# Patient Record
Sex: Female | Born: 1960 | Race: White | Hispanic: No | Smoking: Former smoker
Health system: Southern US, Community
[De-identification: ages and names within clinical notes are randomized; demographics above are authoritative.]

## PROBLEM LIST (undated history)

## (undated) DIAGNOSIS — K220 Achalasia of cardia: Secondary | ICD-10-CM

## (undated) DIAGNOSIS — I10 Essential (primary) hypertension: Secondary | ICD-10-CM

## (undated) DIAGNOSIS — K635 Polyp of colon: Secondary | ICD-10-CM

## (undated) DIAGNOSIS — B353 Tinea pedis: Secondary | ICD-10-CM

## (undated) DIAGNOSIS — Z79899 Other long term (current) drug therapy: Secondary | ICD-10-CM

## (undated) DIAGNOSIS — E785 Hyperlipidemia, unspecified: Secondary | ICD-10-CM

## (undated) DIAGNOSIS — D649 Anemia, unspecified: Secondary | ICD-10-CM

## (undated) DIAGNOSIS — C50919 Malignant neoplasm of unspecified site of unspecified female breast: Secondary | ICD-10-CM

## (undated) DIAGNOSIS — K219 Gastro-esophageal reflux disease without esophagitis: Secondary | ICD-10-CM

## (undated) DIAGNOSIS — I341 Nonrheumatic mitral (valve) prolapse: Secondary | ICD-10-CM

## (undated) HISTORY — DX: Tinea pedis: B35.3

## (undated) HISTORY — PX: OTHER SURGICAL HISTORY: SHX169

## (undated) HISTORY — DX: Other long term (current) drug therapy: Z79.899

## (undated) HISTORY — DX: Hyperlipidemia, unspecified: E78.5

## (undated) HISTORY — PX: APPENDECTOMY: SHX54

## (undated) HISTORY — DX: Achalasia of cardia: K22.0

## (undated) HISTORY — DX: Malignant neoplasm of unspecified site of unspecified female breast: C50.919

## (undated) HISTORY — DX: Essential (primary) hypertension: I10

## (undated) HISTORY — PX: BREAST SURGERY: SHX581

## (undated) HISTORY — DX: Polyp of colon: K63.5

---

## 2000-08-14 ENCOUNTER — Inpatient Hospital Stay (HOSPITAL_COMMUNITY): Admission: AD | Admit: 2000-08-14 | Discharge: 2000-08-14 | Payer: Self-pay | Admitting: Obstetrics and Gynecology

## 2000-08-15 ENCOUNTER — Encounter: Payer: Self-pay | Admitting: Obstetrics and Gynecology

## 2000-09-05 ENCOUNTER — Ambulatory Visit (HOSPITAL_COMMUNITY): Admission: RE | Admit: 2000-09-05 | Discharge: 2000-09-05 | Payer: Self-pay | Admitting: Obstetrics and Gynecology

## 2000-09-05 ENCOUNTER — Encounter: Payer: Self-pay | Admitting: Obstetrics and Gynecology

## 2001-02-06 ENCOUNTER — Inpatient Hospital Stay (HOSPITAL_COMMUNITY): Admission: AD | Admit: 2001-02-06 | Discharge: 2001-02-08 | Payer: Self-pay | Admitting: Obstetrics and Gynecology

## 2001-02-26 ENCOUNTER — Encounter: Admission: RE | Admit: 2001-02-26 | Discharge: 2001-03-28 | Payer: Self-pay | Admitting: Obstetrics and Gynecology

## 2001-04-28 ENCOUNTER — Encounter: Admission: RE | Admit: 2001-04-28 | Discharge: 2001-05-28 | Payer: Self-pay | Admitting: Obstetrics and Gynecology

## 2001-06-28 ENCOUNTER — Encounter: Admission: RE | Admit: 2001-06-28 | Discharge: 2001-07-28 | Payer: Self-pay | Admitting: Obstetrics and Gynecology

## 2001-07-29 ENCOUNTER — Encounter: Admission: RE | Admit: 2001-07-29 | Discharge: 2001-08-28 | Payer: Self-pay | Admitting: Obstetrics and Gynecology

## 2001-08-12 ENCOUNTER — Encounter: Admission: RE | Admit: 2001-08-12 | Discharge: 2001-08-12 | Payer: Self-pay | Admitting: Obstetrics and Gynecology

## 2001-08-12 ENCOUNTER — Encounter: Payer: Self-pay | Admitting: Obstetrics and Gynecology

## 2001-09-18 ENCOUNTER — Encounter (INDEPENDENT_AMBULATORY_CARE_PROVIDER_SITE_OTHER): Payer: Self-pay | Admitting: *Deleted

## 2001-09-18 ENCOUNTER — Ambulatory Visit (HOSPITAL_BASED_OUTPATIENT_CLINIC_OR_DEPARTMENT_OTHER): Admission: RE | Admit: 2001-09-18 | Discharge: 2001-09-18 | Payer: Self-pay | Admitting: *Deleted

## 2001-09-26 ENCOUNTER — Encounter: Admission: RE | Admit: 2001-09-26 | Discharge: 2001-10-26 | Payer: Self-pay | Admitting: Obstetrics and Gynecology

## 2002-02-18 ENCOUNTER — Encounter: Payer: Self-pay | Admitting: Obstetrics and Gynecology

## 2002-02-18 ENCOUNTER — Encounter: Admission: RE | Admit: 2002-02-18 | Discharge: 2002-02-18 | Payer: Self-pay | Admitting: Obstetrics and Gynecology

## 2002-09-20 ENCOUNTER — Other Ambulatory Visit: Admission: RE | Admit: 2002-09-20 | Discharge: 2002-09-20 | Payer: Self-pay | Admitting: Obstetrics and Gynecology

## 2003-04-06 ENCOUNTER — Encounter: Admission: RE | Admit: 2003-04-06 | Discharge: 2003-04-06 | Payer: Self-pay | Admitting: Obstetrics and Gynecology

## 2003-04-06 ENCOUNTER — Encounter: Payer: Self-pay | Admitting: Obstetrics and Gynecology

## 2003-04-08 ENCOUNTER — Encounter: Payer: Self-pay | Admitting: Obstetrics and Gynecology

## 2003-04-08 ENCOUNTER — Encounter: Admission: RE | Admit: 2003-04-08 | Discharge: 2003-04-08 | Payer: Self-pay | Admitting: Obstetrics and Gynecology

## 2003-04-08 ENCOUNTER — Encounter (INDEPENDENT_AMBULATORY_CARE_PROVIDER_SITE_OTHER): Payer: Self-pay | Admitting: Specialist

## 2003-11-29 ENCOUNTER — Other Ambulatory Visit: Admission: RE | Admit: 2003-11-29 | Discharge: 2003-11-29 | Payer: Self-pay | Admitting: Obstetrics and Gynecology

## 2004-04-25 ENCOUNTER — Encounter: Admission: RE | Admit: 2004-04-25 | Discharge: 2004-04-25 | Payer: Self-pay | Admitting: Obstetrics and Gynecology

## 2004-05-01 ENCOUNTER — Encounter: Admission: RE | Admit: 2004-05-01 | Discharge: 2004-05-01 | Payer: Self-pay | Admitting: Obstetrics and Gynecology

## 2004-12-28 ENCOUNTER — Other Ambulatory Visit: Admission: RE | Admit: 2004-12-28 | Discharge: 2004-12-28 | Payer: Self-pay | Admitting: Obstetrics and Gynecology

## 2005-05-24 ENCOUNTER — Encounter: Admission: RE | Admit: 2005-05-24 | Discharge: 2005-05-24 | Payer: Self-pay | Admitting: Obstetrics and Gynecology

## 2006-08-23 DIAGNOSIS — C50919 Malignant neoplasm of unspecified site of unspecified female breast: Secondary | ICD-10-CM

## 2006-08-23 HISTORY — DX: Malignant neoplasm of unspecified site of unspecified female breast: C50.919

## 2006-08-26 ENCOUNTER — Encounter: Admission: RE | Admit: 2006-08-26 | Discharge: 2006-08-26 | Payer: Self-pay | Admitting: *Deleted

## 2006-09-02 ENCOUNTER — Encounter (INDEPENDENT_AMBULATORY_CARE_PROVIDER_SITE_OTHER): Payer: Self-pay | Admitting: Specialist

## 2006-09-02 ENCOUNTER — Encounter (INDEPENDENT_AMBULATORY_CARE_PROVIDER_SITE_OTHER): Payer: Self-pay | Admitting: Radiology

## 2006-09-02 ENCOUNTER — Encounter: Admission: RE | Admit: 2006-09-02 | Discharge: 2006-09-02 | Payer: Self-pay | Admitting: *Deleted

## 2006-09-10 ENCOUNTER — Encounter: Admission: RE | Admit: 2006-09-10 | Discharge: 2006-09-10 | Payer: Self-pay | Admitting: Obstetrics and Gynecology

## 2006-09-16 ENCOUNTER — Ambulatory Visit: Payer: Self-pay | Admitting: Oncology

## 2006-11-12 ENCOUNTER — Inpatient Hospital Stay (HOSPITAL_COMMUNITY): Admission: RE | Admit: 2006-11-12 | Discharge: 2006-11-16 | Payer: Self-pay | Admitting: *Deleted

## 2006-11-12 ENCOUNTER — Encounter (INDEPENDENT_AMBULATORY_CARE_PROVIDER_SITE_OTHER): Payer: Self-pay | Admitting: *Deleted

## 2006-11-12 HISTORY — PX: MASTECTOMY: SHX3

## 2006-11-18 ENCOUNTER — Ambulatory Visit: Payer: Self-pay | Admitting: Oncology

## 2006-11-26 LAB — CBC WITH DIFFERENTIAL/PLATELET
Basophils Absolute: 0 10*3/uL (ref 0.0–0.1)
Eosinophils Absolute: 0.2 10*3/uL (ref 0.0–0.5)
HGB: 10.6 g/dL — ABNORMAL LOW (ref 11.6–15.9)
LYMPH%: 15.4 % (ref 14.0–48.0)
MCV: 89.3 fL (ref 81.0–101.0)
MONO#: 0.6 10*3/uL (ref 0.1–0.9)
MONO%: 5.2 % (ref 0.0–13.0)
NEUT#: 8.3 10*3/uL — ABNORMAL HIGH (ref 1.5–6.5)
Platelets: 519 10*3/uL — ABNORMAL HIGH (ref 145–400)
RBC: 3.42 10*6/uL — ABNORMAL LOW (ref 3.70–5.32)
RDW: 12.7 % (ref 11.3–14.5)
WBC: 10.8 10*3/uL — ABNORMAL HIGH (ref 3.9–10.0)

## 2006-11-29 LAB — COMPREHENSIVE METABOLIC PANEL
Alkaline Phosphatase: 58 U/L (ref 39–117)
CO2: 24 mEq/L (ref 19–32)
Creatinine, Ser: 0.64 mg/dL (ref 0.40–1.20)
Glucose, Bld: 107 mg/dL — ABNORMAL HIGH (ref 70–99)
Sodium: 138 mEq/L (ref 135–145)
Total Bilirubin: 0.5 mg/dL (ref 0.3–1.2)
Total Protein: 6.5 g/dL (ref 6.0–8.3)

## 2006-11-29 LAB — CANCER ANTIGEN 27.29: CA 27.29: 7 U/mL (ref 0–39)

## 2006-11-29 LAB — VITAMIN D PNL(25-HYDRXY+1,25-DIHY)-BLD
Vit D, 1,25-Dihydroxy: 16 pg/mL (ref 6–62)
Vit D, 25-Hydroxy: 29 ng/mL (ref 20–57)

## 2006-11-29 LAB — LACTATE DEHYDROGENASE: LDH: 149 U/L (ref 94–250)

## 2006-12-11 ENCOUNTER — Ambulatory Visit: Payer: Self-pay | Admitting: Psychiatry

## 2006-12-16 ENCOUNTER — Ambulatory Visit: Payer: Self-pay | Admitting: Psychiatry

## 2007-02-17 ENCOUNTER — Ambulatory Visit: Payer: Self-pay | Admitting: Oncology

## 2007-04-15 ENCOUNTER — Ambulatory Visit: Payer: Self-pay | Admitting: Oncology

## 2007-07-09 ENCOUNTER — Encounter: Admission: RE | Admit: 2007-07-09 | Discharge: 2007-07-09 | Payer: Self-pay | Admitting: General Surgery

## 2007-08-28 ENCOUNTER — Ambulatory Visit: Payer: Self-pay | Admitting: Oncology

## 2007-09-01 LAB — COMPREHENSIVE METABOLIC PANEL
AST: 17 U/L (ref 0–37)
Alkaline Phosphatase: 42 U/L (ref 39–117)
BUN: 11 mg/dL (ref 6–23)
Creatinine, Ser: 0.73 mg/dL (ref 0.40–1.20)
Glucose, Bld: 87 mg/dL (ref 70–99)
Total Bilirubin: 0.5 mg/dL (ref 0.3–1.2)

## 2007-09-01 LAB — CBC WITH DIFFERENTIAL/PLATELET
Basophils Absolute: 0 10*3/uL (ref 0.0–0.1)
EOS%: 1.3 % (ref 0.0–7.0)
Eosinophils Absolute: 0.1 10*3/uL (ref 0.0–0.5)
HCT: 36.6 % (ref 34.8–46.6)
HGB: 12.8 g/dL (ref 11.6–15.9)
LYMPH%: 25.7 % (ref 14.0–48.0)
MCH: 31.4 pg (ref 26.0–34.0)
MCV: 89.9 fL (ref 81.0–101.0)
MONO%: 5.4 % (ref 0.0–13.0)
NEUT#: 5.7 10*3/uL (ref 1.5–6.5)
NEUT%: 67.2 % (ref 39.6–76.8)
Platelets: 273 10*3/uL (ref 145–400)
RDW: 12.9 % (ref 11.3–14.5)

## 2007-09-15 ENCOUNTER — Encounter: Admission: RE | Admit: 2007-09-15 | Discharge: 2007-09-15 | Payer: Self-pay | Admitting: Oncology

## 2007-09-29 ENCOUNTER — Encounter: Admission: RE | Admit: 2007-09-29 | Discharge: 2007-09-29 | Payer: Self-pay | Admitting: Oncology

## 2007-11-03 ENCOUNTER — Encounter: Admission: RE | Admit: 2007-11-03 | Discharge: 2007-11-03 | Payer: Self-pay | Admitting: Oncology

## 2008-02-26 ENCOUNTER — Ambulatory Visit: Payer: Self-pay | Admitting: Oncology

## 2008-03-03 LAB — COMPREHENSIVE METABOLIC PANEL
AST: 13 U/L (ref 0–37)
Alkaline Phosphatase: 43 U/L (ref 39–117)
BUN: 15 mg/dL (ref 6–23)
Glucose, Bld: 114 mg/dL — ABNORMAL HIGH (ref 70–99)
Potassium: 4.8 mEq/L (ref 3.5–5.3)
Total Bilirubin: 0.4 mg/dL (ref 0.3–1.2)

## 2008-03-03 LAB — CBC WITH DIFFERENTIAL/PLATELET
Basophils Absolute: 0 10*3/uL (ref 0.0–0.1)
EOS%: 2.9 % (ref 0.0–7.0)
Eosinophils Absolute: 0.2 10*3/uL (ref 0.0–0.5)
HGB: 13.1 g/dL (ref 11.6–15.9)
LYMPH%: 24.6 % (ref 14.0–48.0)
MCH: 32 pg (ref 26.0–34.0)
MCV: 92 fL (ref 81.0–101.0)
MONO%: 6 % (ref 0.0–13.0)
NEUT#: 4.3 10*3/uL (ref 1.5–6.5)
Platelets: 260 10*3/uL (ref 145–400)
RBC: 4.07 10*6/uL (ref 3.70–5.32)
RDW: 13 % (ref 11.3–14.5)

## 2008-08-29 ENCOUNTER — Ambulatory Visit: Payer: Self-pay | Admitting: Oncology

## 2008-08-31 LAB — CBC WITH DIFFERENTIAL/PLATELET
BASO%: 0.2 % (ref 0.0–2.0)
EOS%: 2.1 % (ref 0.0–7.0)
HGB: 12.9 g/dL (ref 11.6–15.9)
MCH: 31.4 pg (ref 25.1–34.0)
MCHC: 34.6 g/dL (ref 31.5–36.0)
MCV: 90.9 fL (ref 79.5–101.0)
MONO%: 4.2 % (ref 0.0–14.0)
RBC: 4.09 10*6/uL (ref 3.70–5.45)
RDW: 13 % (ref 11.2–14.5)
lymph#: 1.9 10*3/uL (ref 0.9–3.3)

## 2008-08-31 LAB — COMPREHENSIVE METABOLIC PANEL
ALT: 11 U/L (ref 0–35)
AST: 14 U/L (ref 0–37)
Albumin: 4.1 g/dL (ref 3.5–5.2)
Alkaline Phosphatase: 43 U/L (ref 39–117)
Calcium: 8.9 mg/dL (ref 8.4–10.5)
Chloride: 105 mEq/L (ref 96–112)
Potassium: 4.1 mEq/L (ref 3.5–5.3)

## 2008-09-19 ENCOUNTER — Ambulatory Visit: Payer: Self-pay | Admitting: Diagnostic Radiology

## 2008-09-19 ENCOUNTER — Ambulatory Visit (HOSPITAL_BASED_OUTPATIENT_CLINIC_OR_DEPARTMENT_OTHER): Admission: RE | Admit: 2008-09-19 | Discharge: 2008-09-19 | Payer: Self-pay | Admitting: Oncology

## 2009-01-12 ENCOUNTER — Ambulatory Visit: Payer: Self-pay | Admitting: Oncology

## 2009-01-16 LAB — COMPREHENSIVE METABOLIC PANEL
ALT: 14 U/L (ref 0–35)
AST: 21 U/L (ref 0–37)
Albumin: 3.6 g/dL (ref 3.5–5.2)
BUN: 12 mg/dL (ref 6–23)
Calcium: 9.3 mg/dL (ref 8.4–10.5)
Chloride: 106 mEq/L (ref 96–112)
Potassium: 4 mEq/L (ref 3.5–5.3)
Sodium: 137 mEq/L (ref 135–145)
Total Protein: 7 g/dL (ref 6.0–8.3)

## 2009-01-16 LAB — CBC WITH DIFFERENTIAL/PLATELET
BASO%: 0.4 % (ref 0.0–2.0)
Eosinophils Absolute: 0.4 10*3/uL (ref 0.0–0.5)
MCV: 88 fL (ref 79.5–101.0)
MONO%: 6.1 % (ref 0.0–14.0)
NEUT#: 5.3 10*3/uL (ref 1.5–6.5)
RBC: 3.91 10*6/uL (ref 3.70–5.45)
RDW: 14.8 % — ABNORMAL HIGH (ref 11.2–14.5)
WBC: 8.4 10*3/uL (ref 3.9–10.3)
nRBC: 0 % (ref 0–0)

## 2009-01-16 LAB — RESEARCH LABS

## 2009-05-30 ENCOUNTER — Ambulatory Visit: Payer: Self-pay | Admitting: Oncology

## 2009-06-06 ENCOUNTER — Encounter: Admission: RE | Admit: 2009-06-06 | Discharge: 2009-06-06 | Payer: Self-pay | Admitting: Oncology

## 2009-09-26 ENCOUNTER — Ambulatory Visit: Payer: Self-pay | Admitting: Oncology

## 2009-09-28 ENCOUNTER — Encounter: Admission: RE | Admit: 2009-09-28 | Discharge: 2009-09-28 | Payer: Self-pay | Admitting: Oncology

## 2009-09-28 LAB — CBC WITH DIFFERENTIAL/PLATELET
BASO%: 0.5 % (ref 0.0–2.0)
Basophils Absolute: 0 10*3/uL (ref 0.0–0.1)
EOS%: 1 % (ref 0.0–7.0)
Eosinophils Absolute: 0.1 10*3/uL (ref 0.0–0.5)
HCT: 37.5 % (ref 34.8–46.6)
HGB: 12.9 g/dL (ref 11.6–15.9)
LYMPH%: 22.7 % (ref 14.0–49.7)
MCH: 31.8 pg (ref 25.1–34.0)
MCHC: 34.4 g/dL (ref 31.5–36.0)
MCV: 92.2 fL (ref 79.5–101.0)
MONO#: 0.4 10*3/uL (ref 0.1–0.9)
MONO%: 4.5 % (ref 0.0–14.0)
NEUT#: 5.9 10*3/uL (ref 1.5–6.5)
NEUT%: 71.3 % (ref 38.4–76.8)
Platelets: 263 10*3/uL (ref 145–400)
RBC: 4.06 10*6/uL (ref 3.70–5.45)
RDW: 13.7 % (ref 11.2–14.5)
WBC: 8.3 10*3/uL (ref 3.9–10.3)
lymph#: 1.9 10*3/uL (ref 0.9–3.3)

## 2009-09-28 LAB — CANCER ANTIGEN 27.29: CA 27.29: 24 U/mL (ref 0–39)

## 2009-09-28 LAB — COMPREHENSIVE METABOLIC PANEL
ALT: 17 U/L (ref 0–35)
AST: 22 U/L (ref 0–37)
Albumin: 3.9 g/dL (ref 3.5–5.2)
Alkaline Phosphatase: 36 U/L — ABNORMAL LOW (ref 39–117)
BUN: 13 mg/dL (ref 6–23)
CO2: 26 mEq/L (ref 19–32)
Calcium: 9.1 mg/dL (ref 8.4–10.5)
Chloride: 106 mEq/L (ref 96–112)
Creatinine, Ser: 0.84 mg/dL (ref 0.40–1.20)
Glucose, Bld: 128 mg/dL — ABNORMAL HIGH (ref 70–99)
Potassium: 3.8 mEq/L (ref 3.5–5.3)
Sodium: 139 mEq/L (ref 135–145)
Total Bilirubin: 0.6 mg/dL (ref 0.3–1.2)
Total Protein: 7.1 g/dL (ref 6.0–8.3)

## 2010-04-04 ENCOUNTER — Ambulatory Visit: Payer: Self-pay | Admitting: Oncology

## 2010-04-06 LAB — CBC WITH DIFFERENTIAL/PLATELET
BASO%: 0.5 % (ref 0.0–2.0)
Basophils Absolute: 0 10*3/uL (ref 0.0–0.1)
EOS%: 2.9 % (ref 0.0–7.0)
Eosinophils Absolute: 0.2 10*3/uL (ref 0.0–0.5)
HCT: 37.2 % (ref 34.8–46.6)
HGB: 12.9 g/dL (ref 11.6–15.9)
LYMPH%: 23.5 % (ref 14.0–49.7)
MCH: 31.6 pg (ref 25.1–34.0)
MCHC: 34.6 g/dL (ref 31.5–36.0)
MCV: 91.4 fL (ref 79.5–101.0)
MONO#: 0.4 10*3/uL (ref 0.1–0.9)
MONO%: 5.8 % (ref 0.0–14.0)
NEUT#: 4.9 10*3/uL (ref 1.5–6.5)
NEUT%: 67.3 % (ref 38.4–76.8)
Platelets: 249 10*3/uL (ref 145–400)
RBC: 4.07 10*6/uL (ref 3.70–5.45)
RDW: 13.7 % (ref 11.2–14.5)
WBC: 7.3 10*3/uL (ref 3.9–10.3)
lymph#: 1.7 10*3/uL (ref 0.9–3.3)

## 2010-04-06 LAB — COMPREHENSIVE METABOLIC PANEL
ALT: 13 U/L (ref 0–35)
AST: 18 U/L (ref 0–37)
Albumin: 4.1 g/dL (ref 3.5–5.2)
Alkaline Phosphatase: 38 U/L — ABNORMAL LOW (ref 39–117)
BUN: 12 mg/dL (ref 6–23)
CO2: 24 mEq/L (ref 19–32)
Calcium: 9.4 mg/dL (ref 8.4–10.5)
Chloride: 105 mEq/L (ref 96–112)
Creatinine, Ser: 0.83 mg/dL (ref 0.40–1.20)
Glucose, Bld: 97 mg/dL (ref 70–99)
Potassium: 4.4 mEq/L (ref 3.5–5.3)
Sodium: 138 mEq/L (ref 135–145)
Total Bilirubin: 0.4 mg/dL (ref 0.3–1.2)
Total Protein: 6.8 g/dL (ref 6.0–8.3)

## 2010-07-14 ENCOUNTER — Encounter: Payer: Self-pay | Admitting: Obstetrics and Gynecology

## 2010-07-14 ENCOUNTER — Other Ambulatory Visit: Payer: Self-pay | Admitting: Oncology

## 2010-07-14 DIAGNOSIS — Z1231 Encounter for screening mammogram for malignant neoplasm of breast: Secondary | ICD-10-CM

## 2010-07-15 ENCOUNTER — Encounter: Payer: Self-pay | Admitting: Hematology & Oncology

## 2010-10-01 ENCOUNTER — Ambulatory Visit: Payer: Self-pay

## 2010-10-11 ENCOUNTER — Ambulatory Visit
Admission: RE | Admit: 2010-10-11 | Discharge: 2010-10-11 | Disposition: A | Discharge status: Home / Self Care | Payer: Commercial Managed Care - PPO | Source: Ambulatory Visit | Attending: Oncology | Admitting: Oncology

## 2010-10-11 DIAGNOSIS — Z1231 Encounter for screening mammogram for malignant neoplasm of breast: Secondary | ICD-10-CM

## 2010-11-06 NOTE — Op Note (Signed)
NAMEDEWANA, AMMIRATI              ACCOUNT NO.:  [id number]   MEDICAL RECORD NO.:  [id number]          PATIENT TYPE:  OIB   LOCATION:  5742                         FACILITY:  [facility]   PHYSICIAN:  Etter Sjogren, M.D.     DATE OF BIRTH:  04/28/1961   DATE OF PROCEDURE:  11/12/2006  DATE OF DISCHARGE:                               OPERATIVE REPORT   PREOPERATIVE DIAGNOSIS:  Breast cancer status post bilateral mastectomy  today.   POSTOPERATIVE DIAGNOSIS:  Breast cancer status post bilateral mastectomy  today.   PROCEDURE PERFORMED:  1. Bilateral transverse rectus abdominis myocutaneous flap      reconstruction.  2. Placement of On-Q pump.   SURGEON:  Etter Sjogren, M.D.   ASSISTANT:  Donzetta Sprung, RNFA.   DRAINS:  5 Blakes were left; 2 left chest, 1 right chest and 2 abdomen.   CLINICAL NOTE:  A 50 year old woman who has breast cancer.  She has  extensive disease, DCIS, on the left side and a prophylactic mastectomy  was planned because of a high risk family history and high risk for  recurrence on her right side.  Various options were discussed with her.  She selected to use her own tissue using a TRAM flap.  The procedure was  discussed with her in great detail.  She understood the risks and  complications.  She stopped smoking 6 weeks ago.  She understood that  she had gained some weight and that did place her at more risk for  healing problems.  The risks and possible complications and overall  convalescence were discussed with her in great detail and she understood  those risks and wished to proceed.   DESCRIPTION OF PROCEDURE:  The bilateral mastectomy had been completed  and the patient was already in the operating room.  The incision was  then made around the umbilicus and a fatty stalk left with the  umbilicus.  The upper limb of the incision was then made and dissection  carried down to the underlying rectus fascia in a cephalad direction in  order to include as many  perforators as possible.  Care was taken to  avoid damage to underlying rectus fascia.  Dissection continued  superiorly and connecting tunnels made through to the mastectomy sites  bilaterally.  The patient placed in a sitting position just briefly just  to make sure of the closure.  This was just a partial sitting and it was  felt that the amount to be removed would permit closure of the abdomen.  The lower limb incision was made and dissection carried down through the  subcutaneous tissue to the underlying fascia.  Flaps elevated from the  lateral aspect up to the lateral perforators.  Parallel incision was  made in the anterior rectus fascia from superior down around the flaps  leaving a strip of fascia on the muscle approximately  2.5 cm wide.  The  rectus muscles were gently dissected free from their surrounding fascial  attachments using the bipolar cautery and a scalpel as indicated.  Great  care was taken to avoid damage to underlying blood  supply to through the  muscle.  Having freed the muscle circumferentially, the mastectomy sites  were checked.  Thorough irrigation with saline.  Meticulous hemostasis  with electrocautery.  Blake drains were positioned, 2 on the left  because that was where the sentinel lymph node was dissected, and 1 on  the right.  These were brought out through separate stab wounds  inferiorly and secured with 3-0 Prolene sutures.  A small hole in the  mastectomy flap that was present from the mastectomy was debrided and  closed with 3-0 Prolene simple interrupted sutures on the left side.  This was inferior mastectomy flap.  The deep inferior gastric vessels  were then identified bilaterally.  They were divided using triple  Ligaclips proximally and double Ligaclips distally toward the flap side.  The muscle was divided using electrocautery and the flaps were gently  elevated and passed through the subcutaneous tunnels to the chest  bilaterally and  temporarily secured with skin staples.  The flaps had  excellent color bilaterally.  Covered with a dry sterile towel.  Attention was then directed back to the abdomen.   After thorough irrigation with saline, the fascia was closed as far as  possible from superior down just below the umbilicus using 0 Prolene  interrupted figure-of-8 sutures taking great care to avoid damage to  underlying abdomen and intraabdominal contents.  A small portion of the  distal wound was closed in like manner and then the remaining fascial  closure was accomplished using a piece of onlay mesh.  This was secured  around the periphery and to the midline very carefully taking great care  to avoid damage to underlying abdominal contents using 0 Prolene  horizontal mattress sutures and then the periphery of the mesh was run  using 2-0 Prolene simple running suture.  Thorough irrigation with  saline.  Excellent hemostasis was noted.  The Blake drains were  positioned and brought through separate stab wounds inferiorly and  secured with 3-0 plain sutures.  The On-Q pump also placed; both limbs  of catheter placed as well from the right side.  The patient then placed  in a semi-Fowler's, but was rolled back into Trendelenburg in order to  keep her head back.  The abdominal closure with 2-0 PDS interrupted  sutures for the Scarpa's layer followed by 2-0 PDO running subcuticular  Quill suture.  Incision made in the midline as had been marked  preoperatively and the umbilicus brought through this opening and it was  secured with 3-0 Monocryl inverted deep sutures and 3-0 Monocryl in a  running subcuticular suture as needed.  Both the umbilicus and the  abdominal skin had excellent color and appeared to be viable.   Attention was then directed to the chest.  The flaps had excellent  color.  A corner of zone 2 was amputated.  Bright-red bleeding denoted  viability.  Thorough irrigation with saline and the flap was  marked for de-epithelialization.  This having been performed, the skin petal inset  with a few  3-0 Monocryl inverted deep sutures and the flap was  suspended superiorly with 3-0 Vicryl interrupted horizontal mattress 3-0  Vicryl sutures and then the Quill suture used, 2-0 PDO running  subcuticular after placing a few 3-0 Monocryl just to set things up for  the closure.  Again, the flaps had excellent color, bright-red bleeding  consistent with viability.   The patient was dressed with Steri-Strips and light dressings and was  transferred to  the recovery room stable having tolerated the procedure  well.      Etter Sjogren, M.D.  Electronically Signed     DB/MEDQ  D:  11/12/2006  T:  11/12/2006  Job:  [id number]

## 2010-11-06 NOTE — Op Note (Signed)
NAMENEVEAH, Jessica Dean              ACCOUNT NO.:  [id number]   MEDICAL RECORD NO.:  [id number]          PATIENT TYPE:  AMB   LOCATION:  SDS                          FACILITY:  [facility]   PHYSICIAN:  Alfonse Ras, MD   DATE OF BIRTH:  February 02, 1961   DATE OF PROCEDURE:  11/12/2006  DATE OF DISCHARGE:                               OPERATIVE REPORT   PREOPERATIVE DIAGNOSIS:  Ductal carcinoma in situ of the left breast and  strong family history of breast cancer.   POSTOPERATIVE DIAGNOSIS:  Ductal carcinoma in situ of the left breast  and strong family history of breast cancer.   PROCEDURE:  Bilateral total mastectomies and left sentinel lymph node  biopsy lymphatic mapping and blue dye injection.   DESCRIPTION OF PROCEDURE:  The patient was taken to the operating room,  placed in a supine position.  After adequate general anesthesia was  induced using endotracheal tube, the abdomen and breasts were prepped  and draped in the normal sterile fashion.  A Foley catheter was placed.  Blue dye injection was made in the retroareolar position of the left  breast and using the Neoprobe, an area of the high activity was  localized in the left axilla.   I then made an elliptical incision around the nipple areolar complex in  the left breast.  I created flaps up to the clavicle and down to the  inframammary fold.  The breast tissue was mobilized.  Using the  Neoprobe, a hot blue lymph node was identified and excised using Bovie  electrocautery and sent for pathologic evaluation which was negative for  malignancy.  The breast tissue was then taken off the pectoralis fascia  out to the latissimus dorsi muscle.  All hemostasis was assured and the  semicircle flap as place.   I then turned my attention to the right breast.  A similar elliptical  incision was made around the nipple areolar complex.  Flaps were made up  to the clavicle, to the inframammary fold, to the sternum and laterally  to the  latissimus dorsi.  All breast tissue was taken off the pectoralis  fascia using Bovie electrocautery.  Adequate hemostasis was ensured and  it was packed also with a wet lap.  The remainder of the dictation will  be performed by Dr. Odis Luster after TRAM reconstruction is performed  bilaterally.   ESTIMATED BLOOD LOSS:  About 50 mL.   Patient remains in the operating room under Dr. Odis Luster care.      Alfonse Ras, MD  Electronically Signed     KRE/MEDQ  D:  11/12/2006  T:  11/12/2006  Job:  [id number]

## 2010-11-06 NOTE — Discharge Summary (Signed)
Jessica Dean, Jessica Dean              ACCOUNT NO.:  [id number]   MEDICAL RECORD NO.:  [id number]          PATIENT TYPE:  OIB   LOCATION:  5742                         FACILITY:  [facility]   PHYSICIAN:  Etter Sjogren, M.D.     DATE OF BIRTH:  [birth year]   DATE OF ADMISSION:  11/12/2006  DATE OF DISCHARGE:  11/16/2006                               DISCHARGE SUMMARY   FINAL DIAGNOSIS:  Breast cancer, bilateral acquired absence of the  breast.   PROCEDURES PERFORMED:  1. Bilateral mastectomy.  2. Sentinel lymph node, left side.  3. Bilateral breast reconstruction, using transverse rectus abdominis      myocutaneous flap.   SUMMARY FOR HISTORY AND PHYSICAL:  A 50 year old woman with breast  cancer, presents for bilateral mastectomy and reconstruction.  Procedure  discussed with her in great detail and she understood the risks and  possible complications and wished to proceed.  She selected bilateral  TRAM flap reconstruction for her reconstruction.  For further details of  history and physical, please see the chart.   COURSE IN HOSPITAL:  On admission, her hemoglobin was 11.2, white blood  cell normal.  She was taken to the operating room.  Mastectomy and  reconstructions were performed using the TRAM flap, tolerated well.   Postoperatively, she did well.  Flaps maintained excellent color.  Abdomen looked very good.  No evidence of any vascular compromise to the  abdominal skin flap.  Blood counts dropped down to 8.8.  She responded  to transfusion of her autologous blood, 1 unit the day after surgery and  the other unit the following day and her hemoglobin stabilized at 9.7.  Drains have functioned well.  She is tolerating a regular diet.  She is  ambulating.  She is ready to discharged.   DISPOSITION:  She is discharged on regular diet.  No lifting, no  vigorous activities, no shower yet.  Empty the drain 3 times a day and  record the amount.  Use spirometer at least 8 times a day at  home.  She  is encouraged to continue to avoid smoking.  She did stop approximately  6 weeks ago.   PRESCRIPTIONS:  1. Vicodin a total of 30, given 1 to 2 p.o. q.6 h. p.r.n. for pain.  2. Keflex 500 mg p.o. q.i.d. for another 2 days.   She will follow up with me this week.      Etter Sjogren, M.D.  Electronically Signed     DB/MEDQ  D:  11/16/2006  T:  11/16/2006  Job:  [id number]

## 2010-11-09 NOTE — H&P (Signed)
[facility]  Patient:    Jessica Dean, Jessica Dean                       MRN: [id number] Adm. Date:  02/06/01 Attending:  Erie Noe P. Pennie Rushing, M.D. Dictator:   Mack Guise, C.N.M.                         History and Physical  DATE OF BIRTH:                Sep 05, 1960  HISTORY OF PRESENT ILLNESS:   Jessica Dean is a gravida 6 para 1-0-4-1 at 40 weeks, EDD February 06, 2001, who presents with spontaneous rupture of membranes at home approximately 3 a.m. this morning for clear fluid.  She is not contracting regularly.  She is not having any bleeding.  She does report positive fetal movement.  Denies any headache, visual changes, or epigastric pain.  Sterile speculum exam finds positive pooling, positive nitrazine, positive fern.  She is admitted to [facility] for induction of labor.  Her pregnancy has been followed by the CNM service at [facility] and is remarkable for: 1. Advanced maternal age. 2. History of hypertension - no medications. 3. Smoker. 4. Three TABs, one SAB. 5. GERD. 6. Mitral valve prolapse. 7. Positive group B strep.  This patient was initially evaluated at the office of [facility] on July 10, 2000 at approximately [redacted] weeks gestation.  EDC determined by dates and confirmed by pregnancy ultrasonography.  AFP testing found increased risk of neural tube defects.  Patient declined amniocentesis.  Level 2 ultrasound at [facility] was within normal limits.  Otherwise her pregnancy has been unremarkable.  She has been size equal to dates throughout, normotensive with no proteinuria.  PRENATAL LABORATORY DATA:     On July 23, 2000, hemoglobin and hematocrit 13.2 and 37.7; platelets 348,000.  Blood type and Rh O positive, antibody screen negative.  VDRL nonreactive.  Rubella immune.  Hepatitis B surface antigen negative.  HIV negative.  Pap smear within normal limits.  GC and chlamydia negative.  One-hour glucose  challenge 149; three-hour GTT within normal limits.  Culture of the vaginal tract at 36 weeks is positive for group B strep.  OBSTETRICAL HISTORY:          In 35, EAB; in 1987, EAB; in 1990, EAB; in 1991, SAB - no D&C.  In 1996 normal spontaneous vaginal delivery with the birth of a 6 pound 7 ounce female infant at term with no complications and the present pregnancy.  MEDICAL HISTORY:              History of mitral valve prolapse.  History of hypertension - no medication.  Achalasia surgery in 1992 to open her esophagus but now has GERD.  SURGICAL HISTORY:             Appendectomy.  Hilar myotomy for achalasia in 1992.  D&C x 3.  FAMILY HISTORY:               Paternal grandfather and maternal grandmother - MI.  Mother, maternal grandmother, maternal grandfather, and paternal grandfather with hypertension.  Paternal grandfather with diabetes.  Mother with a history of breast cancer, age 49.  Maternal grandfather - liver cancer.  GENETIC HISTORY:              Patient is 50 years old, advanced maternal age. Otherwise there is no  history of familial or genetic disorders, children that died in infancy or that were born with birth defects.  ALLERGIES:                    No known drug allergies.  HABITS:                       Patient is a smoker.  She smokes approximately one pack of cigarettes per day and denies the use of alcohol or illicit drugs.  REVIEW OF SYSTEMS:            There are no signs or symptoms suggestive of focal or systemic disease and the patient is typical of one with a uterine pregnancy at term with premature rupture of membranes.  SOCIAL HISTORY:               Jessica Dean is a 50 year old Caucasian female. Her husband, Devan Babino, is involved and supportive.  They are of the [place] faith.  PHYSICAL EXAMINATION:  VITAL SIGNS:                  Stable, afebrile.  HEENT:                        Unremarkable.  HEART:                         Regular rate and rhythm.  LUNGS:                        Clear.  ABDOMEN:                      Gravid in its contour.  Uterine fundus is noted to extend 39 cm above the level of the pubic symphysis.  Leopolds maneuvers find the infant to be in a longitudinal lie, cephalic presentation, and the estimated fetal weight is 7.5 pounds.  PELVIC:                       Digital exam of the cervix finds it to be 2-3 cm dilated, 80% effaced, with the cephalic presenting part at a -1 station.  Sterile speculum exam finds positive pooling, positive nitrazine, positive fern.  Patient leaking small amount of clear fluid.  EXTREMITIES:                  Show no pathologic edema.  DTRs are 1+ with no clonus.  ASSESSMENT:                   1. Intrauterine pregnancy at term.                               2. Premature rupture of membranes x 12 hours.  PLAN:                         1. Admit per Dr. Dierdre Forth.                               2. Routine CNM orders.  3. Start Penicillin G prophylaxis for group B                                  strep.                               4. Start Pitocin per low-dose protocol.                               5. Anticipate spontaneous vaginal delivery. DD:  02/06/01 TD:  02/06/01 Job: [id number]

## 2010-11-09 NOTE — Op Note (Signed)
[facility]  Patient:    Jessica Dean, Jessica Dean Visit Number: [id number] MRN: [id number]          Service Type: DSU Location: [facility] Attending Physician:  Vikki Ports Dictated by:   Vikki Ports, M.D. Proc. Date: 09/21/01 Admit Date:  09/18/2001 Discharge Date: 09/18/2001                             Operative Report  DATE OF BIRTH:  2060-10-03  PREOPERATIVE DIAGNOSIS:  Right chest wall nevus and left breast mass.  POSTOPERATIVE DIAGNOSIS:  Right chest wall nevus and left breast mass.  PROCEDURE:  Punch biopsy of right chest wall nevus, and left breast biopsy.  SURGEON:  Vikki Ports, M.D.  ANESTHESIA:  Local MAC.  DESCRIPTION OF PROCEDURE:  The patient was taken to the operating room, placed in supine position.  After adequate anesthesia was induced using MAC technique, the right chest wall and left breast were prepped and draped in the normal sterile fashion.  A 4 mm punch biopsy was taken of the nevus and the lateral breast after the skin was anesthetized with lidocaine.  The small defect was closed with a 3-0 nylon suture.  I then turned my attention to the left breast.  The palpable mass in the 12 oclock region of the left breast, skin and subcutaneous tissue surrounding was anesthetized.  A curvilinear incision was made.  I dissected down to a depth of about 2 cm onto a well encapsulated nodule which was excised in its entirety.  Adequate hemostasis was ensured.  No other pathology was noted. Skin was closed with subcuticular 4-0 Monocryl.  Steri-Strips and sterile dressing was applied.  The patient tolerated the procedure well, and went to PACU in good condition. Dictated by:   Vikki Ports, M.D. Attending Physician:  Danna Hefty R. DD:  09/21/01 TD:  09/21/01 Job: [id number]

## 2011-09-26 ENCOUNTER — Other Ambulatory Visit: Payer: Self-pay | Admitting: Hematology and Oncology

## 2011-09-26 DIAGNOSIS — Z1231 Encounter for screening mammogram for malignant neoplasm of breast: Secondary | ICD-10-CM

## 2011-10-22 ENCOUNTER — Ambulatory Visit
Admission: RE | Admit: 2011-10-22 | Discharge: 2011-10-22 | Disposition: A | Discharge status: Home / Self Care | Payer: Commercial Managed Care - PPO | Source: Ambulatory Visit | Attending: Hematology and Oncology | Admitting: Hematology and Oncology

## 2011-10-22 DIAGNOSIS — Z1231 Encounter for screening mammogram for malignant neoplasm of breast: Secondary | ICD-10-CM

## 2012-06-02 ENCOUNTER — Encounter: Payer: Self-pay | Admitting: Internal Medicine

## 2012-07-01 ENCOUNTER — Encounter: Payer: Self-pay | Admitting: Internal Medicine

## 2012-07-01 ENCOUNTER — Ambulatory Visit (INDEPENDENT_AMBULATORY_CARE_PROVIDER_SITE_OTHER): Payer: Commercial Managed Care - PPO | Admitting: Internal Medicine

## 2012-07-01 VITALS — BP 140/82 | HR 72 | Ht 65.75 in | Wt 173.8 lb

## 2012-07-01 DIAGNOSIS — K220 Achalasia of cardia: Secondary | ICD-10-CM

## 2012-07-01 DIAGNOSIS — Z1211 Encounter for screening for malignant neoplasm of colon: Secondary | ICD-10-CM

## 2012-07-01 DIAGNOSIS — K219 Gastro-esophageal reflux disease without esophagitis: Secondary | ICD-10-CM

## 2012-07-01 DIAGNOSIS — E611 Iron deficiency: Secondary | ICD-10-CM

## 2012-07-01 DIAGNOSIS — R1013 Epigastric pain: Secondary | ICD-10-CM

## 2012-07-01 DIAGNOSIS — D509 Iron deficiency anemia, unspecified: Secondary | ICD-10-CM

## 2012-07-01 DIAGNOSIS — R1310 Dysphagia, unspecified: Secondary | ICD-10-CM

## 2012-07-01 MED ORDER — NA SULFATE-K SULFATE-MG SULF 17.5-3.13-1.6 GM/177ML PO SOLN: ORAL | Status: DC

## 2012-07-01 MED ORDER — OMEPRAZOLE-SODIUM BICARBONATE 40-1100 MG PO CAPS: 1.0000 | ORAL_CAPSULE | Freq: Every day | ORAL | Status: DC

## 2012-07-01 NOTE — Patient Instructions (Addendum)
You have been scheduled for an endoscopy and colonoscopy with propofol. Please follow the written instructions given to you at your visit today. Please pick up your prep at the pharmacy within the next 1-3 days. If you use inhalers (even only as needed) or a CPAP machine, please bring them with you on the day of your procedure.  Today you have been given Zegerid samples to try, take one capsule . Before breakfast.  We are giving you life style changes sheet on GERD.  Stop donating blood for now.  Thank you for choosing me and [facility].  Iva Boop, M.D., [facility]

## 2012-07-01 NOTE — Progress Notes (Signed)
Subjective:    Patient ID: Jessica Dean, female    DOB: 02-03-1961, 52 y.o.   MRN: [id number]  HPI Is a very pleasant separated 52 year old woman who presents to discuss colonoscopy. She is followed by Dr. Ottis Stain of [facility], with a history of breast cancer. She has been on iron for several years. Is not noted any bleeding from her rectum or her gut. She does eat a fairly normal diet I believe. She has been a regular donator of blood every 6 weeks since 2009. Her hemoglobin is normal but her ferritin is 11 as of November 2013. She has never had a screening colonoscopy, she has just turned 51.  She reports a history of Heller myotomy for achalasia in the early 1990s. Since that time she has done reasonably well though she does complain of regurgitation and reflux at night sometimes. If she eats late she will take TUMS and proper head of bed up but that still causes some problems. She also had several episodes a year perhaps up to 6 of intense epigastric squeezing pressure pain that is relieved only by regurgitation or vomiting. Her weight has been stable. Had multiple upper endoscopies in the past the last being about 10 years ago. She does not recall any particular history of strictures or complications of her achalasia related to that. Allergies  Allergen Reactions  . Iohexol      Code: HIVES, Desc: developed hives after injection   . Neosporin (Neomycin-Bacitracin Zn-Polymyx) Rash  . Tape Rash   Outpatient Prescriptions Prior to Visit  Medication Sig Dispense Refill  . ferrous sulfate 325 (65 FE) MG tablet Take 325 mg by mouth daily with breakfast.      . TAMOXIFEN CITRATE PO Take 20 mg by mouth daily.      . vitamin C (ASCORBIC ACID) 500 MG tablet Take 500 mg by mouth daily.       Last reviewed on 07/01/2012  9:40 AM by Iva Boop, MD Past Medical History  Diagnosis Date  . Breast cancer 08/2006    left   . Hyperlipidemia   . Tinea pedis   . High risk  medication use   . Hypertension   . Achalasia    Past Surgical History  Procedure Date  . Appendectomy   . Mastectomy 11/12/2006    bilateral, TRAM flap reconstruction  . Hellermyotomy     esophagus  . Upper gastrointestinal endoscopy    History   Social History  . Marital Status:  separated     Spouse Name: N/A    Number of Children: 2       Occupational History  . recruiter in [employer]   .     Social History Main Topics  . Smoking status: Current Every Day Smoker  . Smokeless tobacco: Never Used  . Alcohol Use: Yes  . Drug Use: No  . Sexually Active: None   Other Topics Concern  . None   Social History Narrative   Currently separated as of January [address] daughters, emplloyed as a Photographer for [employer]   Family History  Problem Relation Age of Onset  . Hypertension Mother   . Hypertension Paternal Grandfather   . Hypertension Maternal Grandfather   . Breast cancer Mother 72    died age 10  . Cancer Maternal Grandfather     Review of Systems This is positive for history of mitral valve prolapse and pain with menses. She actually had  an IUD in place and did not have menses for the last decade.    Objective:   Physical Exam General:  Well-developed, well-nourished and in no acute distress Eyes:  anicteric. ENT:   Mouth and posterior pharynx free of lesions.  Neck:   supple w/o thyromegaly or mass.  Lungs: Clear to auscultation bilaterally. Heart:  S1S2, no rubs, murmurs, gallops. Abdomen:  soft, non-tender, no hepatosplenomegaly, hernia, or mass and BS+.  Rectal: Lymph:  no cervical or supraclavicular adenopathy. Extremities:   no edema Skin   no rash. Neuro:  A&O x 3.  Psych:  appropriate mood and  Affect.   Data Reviewed: November her ferritin was 11. Hemoglobin 11.8 with a normal MCV. Her TIBC is high with a saturation of 34%. I reviewed labs from November hematology oncology note from Dr. Ottis Stain in November. Previous breast  cancer pathology as well.   2009 CT abd/pelvis in EMR Assessment & Plan:   1. Iron deficiency - thought due to repeated blood donation   2. Achalasia   3. GERD (gastroesophageal reflux disease)   4. Epigastric pain   5. Dysphagia   6. Special screening for malignant neoplasms, colon    1. She should have a screening colonoscopy. I think her iron deficiency is related to continued blood donation I've asked her to stop that. 2. Samples of Zegerid 40 mg every morning prescribed for the patient. While we await an upper endoscopy as well. She may need a dilation. It sounds to me like she is having several episodes of dysphagia throughout the year which of these epigastric pain and regurgitation and vomiting of food episodes. She could have developed a post Heller myotomy stricture or distal reflux changes. Could be related to her and paraesophageal motility. 3. She smokes and drinks 3-4 caffeinated beverages a day, I did not addressed that right now but need to have the future. Lifestyle handout given to the patient. With respect to GERD. The risks and benefits as well as alternatives of endoscopic procedure(s) have been discussed and reviewed. All questions answered. The patient agrees to proceed. Given achalasia will ask that she fast 4 hours before procedures to reduce aspiration risk  I appreciate the opportunity to care for this patient.  CC: Leo Grosser, MD, Foy Guadalajara, MD

## 2012-07-07 ENCOUNTER — Telehealth: Payer: Self-pay | Admitting: Internal Medicine

## 2012-07-07 NOTE — Telephone Encounter (Signed)
I have left a detailed message for the patient that she does not have to consent to dilation.  I have advised her that Dr. Leone Payor could speak with her prior to consenting to any procedure.  She is asked to call me back this pm if she has additional questions or she can discuss with him tomorrow at the procedure.  I have notified Milford Cage, CMA in endo admitting to alert staff to have Dr. Leone Payor speak with her about concerns prior to consenting procedures tomorrow.

## 2012-07-08 ENCOUNTER — Ambulatory Visit (AMBULATORY_SURGERY_CENTER): Payer: Commercial Managed Care - PPO | Admitting: Internal Medicine

## 2012-07-08 ENCOUNTER — Encounter: Payer: Self-pay | Admitting: Internal Medicine

## 2012-07-08 VITALS — BP 138/80 | HR 82 | Temp 99.6°F | Resp 17 | Ht 65.0 in | Wt 173.0 lb

## 2012-07-08 DIAGNOSIS — K220 Achalasia of cardia: Secondary | ICD-10-CM

## 2012-07-08 DIAGNOSIS — K219 Gastro-esophageal reflux disease without esophagitis: Secondary | ICD-10-CM

## 2012-07-08 DIAGNOSIS — D126 Benign neoplasm of colon, unspecified: Secondary | ICD-10-CM

## 2012-07-08 DIAGNOSIS — Z1211 Encounter for screening for malignant neoplasm of colon: Secondary | ICD-10-CM

## 2012-07-08 DIAGNOSIS — R1310 Dysphagia, unspecified: Secondary | ICD-10-CM

## 2012-07-08 HISTORY — PX: COLONOSCOPY: SHX174

## 2012-07-08 HISTORY — PX: UPPER GASTROINTESTINAL ENDOSCOPY: SHX188

## 2012-07-08 MED ORDER — SODIUM CHLORIDE 0.9 % IV SOLN: 500.0000 mL | INTRAVENOUS | Status: DC

## 2012-07-08 NOTE — Op Note (Signed)
[facility] [address] [place], [address]   COLONOSCOPY PROCEDURE REPORT  PATIENT: Jessica Dean, Jessica Dean  MR#: [id number] BIRTHDATE: 13-Apr-1961 , 51  yrs. old GENDER: Female ENDOSCOPIST: Iva Boop, MD, [facility] REFERRED XB:[id number] Tanya Nones, M.D. PROCEDURE DATE:  07/08/2012 PROCEDURE:   Colonoscopy with snare polypectomy ASA CLASS:   Class II INDICATIONS:average risk screening. MEDICATIONS: There was residual sedation effect present from prior procedure, Propofol (Diprivan) 550 mg IV, MAC sedation, administered by CRNA, and These medications were titrated to patient response per physician's verbal order  DESCRIPTION OF PROCEDURE:   After the risks benefits and alternatives of the procedure were thoroughly explained, informed consent was obtained.  A digital rectal exam revealed no abnormalities of the rectum.   The LB CF-H180AL [id number]  endoscope was introduced through the anus and advanced to the cecum, which was identified by both the appendix and ileocecal valve. No adverse events experienced.   The quality of the prep was Suprep excellent The instrument was then slowly withdrawn as the colon was fully examined.      COLON FINDINGS: Five smooth and polypoid shaped sessile polyps measuring 4-9 mm in size were found in the ascending colon, descending colon, transverse colon, and rectum.  A polypectomy was performed with a cold snare.  The resection was complete and the polyp tissue was completely retrieved.   The colon mucosa was otherwise normal.   A right colon retroflexion was performed. Retroflexed views revealed no abnormalities. The time to cecum=4 minutes 32 seconds.  Withdrawal time=14 minutes 47 seconds.  The scope was withdrawn and the procedure completed. COMPLICATIONS: There were no complications.  ENDOSCOPIC IMPRESSION: 1.   Five sessile polyps measuring 4-9 mm in size were found in the ascending colon, descending colon, transverse colon,  and rectum; polypectomy was performed with a cold snare 2.   The colon mucosa was otherwise normal - excellent prep  RECOMMENDATIONS: 1.  Office will call with the results. 2.  Timing of repeat colonoscopy will be determined by pathology findings.   eSigned:  Iva Boop, MD, [facility] 07/08/2012 5:26 PM cc: Lynnea Ferrier, MD and The Patient  and Foy Guadalajara, MD

## 2012-07-08 NOTE — Progress Notes (Signed)
Patient did not have preoperative order for IV antibiotic SSI prophylaxis. (G8918)  Patient did not experience any of the following events: a burn prior to discharge; a fall within the facility; wrong site/side/patient/procedure/implant event; or a hospital transfer or hospital admission upon discharge from the facility. (G8907)  

## 2012-07-08 NOTE — Patient Instructions (Addendum)
There was no stricture seen. The esophagus looks irritated - esophagitis - I took biopsies to understand that but probably related to food sitting in esophagus at times. Will call results.  The colonoscopy found 4 polyps that were removed.  I will let you know pathology results and when to have another routine colonoscopy.  Thank you for choosing me and [facility].  Iva Boop, MD, [facility]   Resume medications. Information given on polyps,esophagitis with discharge instructions.  YOU HAD AN ENDOSCOPIC PROCEDURE TODAY AT THE [facility]: Refer to the procedure report that was given to you for any specific questions about what was found during the examination.  If the procedure report does not answer your questions, please call your gastroenterologist to clarify.  If you requested that your care partner not be given the details of your procedure findings, then the procedure report has been included in a sealed envelope for you to review at your convenience later.  YOU SHOULD EXPECT: Some feelings of bloating in the abdomen. Passage of more gas than usual.  Walking can help get rid of the air that was put into your GI tract during the procedure and reduce the bloating. If you had a lower endoscopy (such as a colonoscopy or flexible sigmoidoscopy) you may notice spotting of blood in your stool or on the toilet paper. If you underwent a bowel prep for your procedure, then you may not have a normal bowel movement for a few days.  DIET: Your first meal following the procedure should be a light meal and then it is ok to progress to your normal diet.  A half-sandwich or bowl of soup is an example of a good first meal.  Heavy or fried foods are harder to digest and may make you feel nauseous or bloated.  Likewise meals heavy in dairy and vegetables can cause extra gas to form and this can also increase the bloating.  Drink plenty of fluids but you should avoid alcoholic beverages  for 24 hours.  ACTIVITY: Your care partner should take you home directly after the procedure.  You should plan to take it easy, moving slowly for the rest of the day.  You can resume normal activity the day after the procedure however you should NOT DRIVE or use heavy machinery for 24 hours (because of the sedation medicines used during the test).    SYMPTOMS TO REPORT IMMEDIATELY: A gastroenterologist can be reached at any hour.  During normal business hours, 8:30 AM to 5:00 PM Monday through Friday, call [phone].  After hours and on weekends, please call the GI answering service at [phone] who will take a message and have the physician on call contact you.   Following lower endoscopy (colonoscopy or flexible sigmoidoscopy):  Excessive amounts of blood in the stool  Significant tenderness or worsening of abdominal pains  Swelling of the abdomen that is new, acute  Fever of 100F or higher  Following upper endoscopy (EGD)  Vomiting of blood or coffee ground material  New chest pain or pain under the shoulder blades  Painful or persistently difficult swallowing  New shortness of breath  Fever of 100F or higher  Black, tarry-looking stools  FOLLOW UP: If any biopsies were taken you will be contacted by phone or by letter within the next 1-3 weeks.  Call your gastroenterologist if you have not heard about the biopsies in 3 weeks.  Our staff will call the home number listed on your records  the next business day following your procedure to check on you and address any questions or concerns that you may have at that time regarding the information given to you following your procedure. This is a courtesy call and so if there is no answer at the home number and we have not heard from you through the emergency physician on call, we will assume that you have returned to your regular daily activities without incident.  SIGNATURES/CONFIDENTIALITY: You and/or your care partner have  signed paperwork which will be entered into your electronic medical record.  These signatures attest to the fact that that the information above on your After Visit Summary has been reviewed and is understood.  Full responsibility of the confidentiality of this discharge information lies with you and/or your care-partner.

## 2012-07-08 NOTE — Op Note (Signed)
 [facility] [address] [place], [address]   ENDOSCOPY PROCEDURE REPORT  PATIENT: Jessica, Dean  MR#: [id number] BIRTHDATE: 07-10-1960 , 51  yrs. old GENDER: Female ENDOSCOPIST: Iva Boop, MD, [facility] REFERRED BY:  Lynnea Ferrier, M.D. PROCEDURE DATE:  07/08/2012 PROCEDURE:  EGD w/ biopsy ASA CLASS:     Class II INDICATIONS:  Dysphagia. MEDICATIONS: propofol (Diprivan) 200mg  IV, MAC sedation, administered by CRNA, and These medications were titrated to patient response per physician's verbal order TOPICAL ANESTHETIC: Cetacaine Spray  DESCRIPTION OF PROCEDURE: After the risks benefits and alternatives of the procedure were thoroughly explained, informed consent was obtained.  The LB GIF-H180 [id number] endoscope was introduced through the mouth and advanced to the second portion of the duodenum. Without limitations.  The instrument was slowly withdrawn as the mucosa was fully examined.        ESOPHAGUS: Abnormal mucosa was found in the entire esophagus.  The mucosa was edematous and had granularity.  Multiple biopsies were performed using cold forceps.  Sample sent for histology.  The remainder of the upper endoscopy exam was otherwise normal. Retroflexed views revealed no abnormalities.   Intact wrap.  The scope was then withdrawn from the patient and the procedure completed.  COMPLICATIONS: There were no complications. ENDOSCOPIC IMPRESSION: 1.   Abnormal mucosa was found in the entire esophagus; The mucosa was edematous and had granularity; multiple biopsies 2.   The remainder of the upper endoscopy exam was otherwise normal  RECOMMENDATIONS: 1.  Await pathology results 2.  Office will call with results   eSigned:  Iva Boop, MD, [facility] 07/08/2012 5:23 PM VH:QIONGE Tanya Nones, MD and The Patient  and Foy Guadalajara, MD

## 2012-07-08 NOTE — Progress Notes (Signed)
Called to room to assist during endoscopic procedure.  Patient ID and intended procedure confirmed with present staff. Received instructions for my participation in the procedure from the performing physician. ewm 

## 2012-07-09 ENCOUNTER — Telehealth: Payer: Self-pay

## 2012-07-09 NOTE — Telephone Encounter (Signed)
  Follow up Call-  Call back number 07/08/2012  Post procedure Call Back phone  # [phone]  Permission to leave phone message Yes     Patient questions:  Do you have a fever, pain , or abdominal swelling? no Pain Score  0 *  Have you tolerated food without any problems? yes  Have you been able to return to your normal activities? yes  Do you have any questions about your discharge instructions: Diet   no Medications  no Follow up visit  no  Do you have questions or concerns about your Care? no  Actions: * If pain score is 4 or above: No action needed, pain <4.

## 2012-07-15 ENCOUNTER — Encounter: Payer: Self-pay | Admitting: Internal Medicine

## 2012-07-15 ENCOUNTER — Telehealth: Payer: Self-pay | Admitting: Internal Medicine

## 2012-07-15 ENCOUNTER — Other Ambulatory Visit: Payer: Self-pay

## 2012-07-15 MED ORDER — OMEPRAZOLE 40 MG PO CPDR: 40.0000 mg | DELAYED_RELEASE_CAPSULE | Freq: Every day | ORAL | Status: DC

## 2012-07-15 NOTE — Telephone Encounter (Signed)
Patient advised See results on pathology report

## 2012-07-15 NOTE — Progress Notes (Signed)
Quick Note:  Office to let her know polyps benign but pre-cancerous - repeat colonoscopy 5 yrs 2019  esophageal bxs inflammation - I think she should keep taking a PPI and we can rx oemprazole generic 40 mg before breakfast #30 or 90 with 1 year refills Work on reducing caffeine and stopping smoking  See me annually  [facility]  5 yr colon recall, I will also send letter for completeness   ______

## 2013-05-27 ENCOUNTER — Ambulatory Visit
Admission: RE | Admit: 2013-05-27 | Discharge: 2013-05-27 | Disposition: A | Discharge status: Home / Self Care | Payer: BC Managed Care – PPO | Source: Ambulatory Visit | Attending: Obstetrics and Gynecology | Admitting: Obstetrics and Gynecology

## 2013-05-27 ENCOUNTER — Ambulatory Visit
Admission: RE | Admit: 2013-05-27 | Discharge: 2013-05-27 | Disposition: A | Discharge status: Home / Self Care | Payer: Commercial Managed Care - PPO | Source: Ambulatory Visit | Attending: Obstetrics and Gynecology | Admitting: Obstetrics and Gynecology

## 2013-05-27 ENCOUNTER — Other Ambulatory Visit: Payer: Self-pay | Admitting: Obstetrics and Gynecology

## 2013-05-27 DIAGNOSIS — Z853 Personal history of malignant neoplasm of breast: Secondary | ICD-10-CM

## 2013-05-27 DIAGNOSIS — N644 Mastodynia: Secondary | ICD-10-CM

## 2013-06-11 ENCOUNTER — Other Ambulatory Visit: Payer: Self-pay

## 2013-06-11 MED ORDER — OMEPRAZOLE 40 MG PO CPDR: 40.0000 mg | DELAYED_RELEASE_CAPSULE | Freq: Every day | ORAL | Status: DC

## 2013-09-07 ENCOUNTER — Other Ambulatory Visit: Payer: Self-pay | Admitting: Internal Medicine

## 2013-11-09 ENCOUNTER — Other Ambulatory Visit: Payer: Self-pay

## 2013-11-09 ENCOUNTER — Telehealth: Payer: Self-pay | Admitting: Internal Medicine

## 2013-11-09 MED ORDER — OMEPRAZOLE 40 MG PO CPDR: 40.0000 mg | DELAYED_RELEASE_CAPSULE | Freq: Every day | ORAL | Status: DC

## 2013-11-09 NOTE — Telephone Encounter (Signed)
Refill sent in as requested. 

## 2013-12-23 ENCOUNTER — Encounter: Payer: Self-pay | Admitting: *Deleted

## 2013-12-31 ENCOUNTER — Ambulatory Visit (INDEPENDENT_AMBULATORY_CARE_PROVIDER_SITE_OTHER): Payer: BC Managed Care – PPO | Admitting: Internal Medicine

## 2013-12-31 ENCOUNTER — Encounter: Payer: Self-pay | Admitting: Internal Medicine

## 2013-12-31 VITALS — BP 122/64 | HR 88 | Ht 65.0 in | Wt 147.0 lb

## 2013-12-31 DIAGNOSIS — K219 Gastro-esophageal reflux disease without esophagitis: Secondary | ICD-10-CM

## 2013-12-31 DIAGNOSIS — K220 Achalasia of cardia: Secondary | ICD-10-CM | POA: Insufficient documentation

## 2013-12-31 MED ORDER — OMEPRAZOLE 40 MG PO CPDR: 40.0000 mg | DELAYED_RELEASE_CAPSULE | Freq: Every day | ORAL | Status: DC

## 2013-12-31 NOTE — Progress Notes (Signed)
Patient ID: Jessica Dean, female   DOB: 12-12-1960, 53 y.o.   MRN: [id number]       The patient is here for followup of gastroeesophageal reflux disease in the setting of prior Heller myotomy for achalasia. She ran out of her omeprazole and had a symptomatic flare. We had prescribed prior to this visit for short term. She is doing well otherwise without any dysphagia.  Chart is reviewed today. Medications updated.  Esophageal reflux Doing well we'll continue omeprazole. She is at risk for esophageal stricture given prior Heller myotomy. She can see me as needed. I think she should be on a get further refills a PPI through PCP. She prefers this approach.  Achalasia-status post Heller myotomy Doing well at this time, continue PPI for reflux associated with this.

## 2013-12-31 NOTE — Patient Instructions (Signed)
Let me know if you are having problems. You should be able to refill omeprazole through your PCP.  I appreciate the opportunity to care for you. Gatha Mayer, MD, [facility]

## 2013-12-31 NOTE — Assessment & Plan Note (Signed)
Doing well at this time, continue PPI for reflux associated with this.

## 2013-12-31 NOTE — Assessment & Plan Note (Signed)
Doing well we'll continue omeprazole. She is at risk for esophageal stricture given prior Heller myotomy. She can see me as needed. I think she should be on a get further refills a PPI through PCP. She prefers this approach.

## 2014-01-10 ENCOUNTER — Telehealth: Payer: Self-pay

## 2014-01-10 NOTE — Telephone Encounter (Signed)
Did prior authorization over the phone with Maddy at [employer] # [phone].  Member ID # [id number].  This was for omeprazole 40 mg capsules, one daily, #90.  Dx: 530.81.  It was approved for a year thru 01/10/2015.  They will send patient notification and [place] a confirmation of this as well.  I contacted the [facility] in [place] , [place] of this as well, # [phone].  It ran thru for them as he did it with me on the phone.

## 2015-01-11 ENCOUNTER — Telehealth: Payer: Self-pay

## 2015-01-11 NOTE — Telephone Encounter (Signed)
Spoke with Shaquita at [employer] # [phone] to do a prior authorization for omeprazole 40mg  capsules, takes one a day. Dx: GERD K21.9 and K22.0 Achalasia.  Approved from 12/12/14-01/11/16.  Got approval letter, will send to be scanned into Epic.

## 2015-03-03 ENCOUNTER — Other Ambulatory Visit: Payer: Self-pay | Admitting: Internal Medicine

## 2015-06-11 ENCOUNTER — Other Ambulatory Visit: Payer: Self-pay | Admitting: Internal Medicine

## 2015-08-07 ENCOUNTER — Encounter: Payer: Self-pay | Admitting: Family Medicine

## 2016-06-11 ENCOUNTER — Other Ambulatory Visit: Payer: Self-pay | Admitting: Obstetrics & Gynecology

## 2016-06-19 ENCOUNTER — Encounter (HOSPITAL_COMMUNITY): Payer: Self-pay | Admitting: *Deleted

## 2016-06-20 NOTE — Patient Instructions (Addendum)
Your procedure is scheduled on:  Thursday, Jan. 4, 2018  Enter through the [employer] [facility] at:  8:00 AM  Pick up the phone at the desk and dial [phone].  Call this number if you have problems the morning of surgery: [phone].  Remember: Do NOT eat food or drink after:  Midnight Wednesday, Jan. 3, 2018  Take these medicines the morning of surgery with a SIP OF WATER:  Omeprazole  Stop ALL herbal medications at this time  DO NOT SMOKE THE DAY OF SURGERY  Do NOT wear jewelry (body piercing), metal hair clips/bobby pins, make-up, or nail polish. Do NOT wear lotions, powders, or perfumes.  You may wear deodorant. Do NOT shave for 48 hours prior to surgery. Do NOT bring valuables to the hospital. Contacts, dentures, or bridgework may not be worn into surgery.  Have a responsible adult drive you home and stay with you for 24 hours after your procedure

## 2016-06-21 ENCOUNTER — Encounter (HOSPITAL_COMMUNITY): Payer: Self-pay

## 2016-06-21 ENCOUNTER — Encounter (HOSPITAL_COMMUNITY)
Admission: RE | Admit: 2016-06-21 | Discharge: 2016-06-21 | Disposition: A | Discharge status: Home / Self Care | Payer: BC Managed Care – PPO | Source: Ambulatory Visit | Attending: Obstetrics & Gynecology | Admitting: Obstetrics & Gynecology

## 2016-06-21 DIAGNOSIS — N840 Polyp of corpus uteri: Secondary | ICD-10-CM | POA: Diagnosis present

## 2016-06-21 HISTORY — DX: Nonrheumatic mitral (valve) prolapse: I34.1

## 2016-06-21 HISTORY — DX: Anemia, unspecified: D64.9

## 2016-06-21 HISTORY — DX: Gastro-esophageal reflux disease without esophagitis: K21.9

## 2016-06-21 LAB — CBC
HEMATOCRIT: 35.4 % — AB (ref 36.0–46.0)
HEMOGLOBIN: 11.8 g/dL — AB (ref 12.0–15.0)
MCH: 28.9 pg (ref 26.0–34.0)
MCHC: 33.3 g/dL (ref 30.0–36.0)
MCV: 86.6 fL (ref 78.0–100.0)
Platelets: 314 10*3/uL (ref 150–400)
RBC: 4.09 MIL/uL (ref 3.87–5.11)
RDW: 15.3 % (ref 11.5–15.5)
WBC: 7.5 10*3/uL (ref 4.0–10.5)

## 2016-06-27 ENCOUNTER — Encounter (HOSPITAL_COMMUNITY)
Admission: RE | Disposition: A | Discharge status: Home / Self Care | Payer: Self-pay | Source: Ambulatory Visit | Attending: Obstetrics & Gynecology

## 2016-06-27 ENCOUNTER — Encounter (HOSPITAL_COMMUNITY): Payer: Self-pay

## 2016-06-27 ENCOUNTER — Ambulatory Visit (HOSPITAL_COMMUNITY)
Admission: RE | Admit: 2016-06-27 | Discharge: 2016-06-27 | Disposition: A | Discharge status: Home / Self Care | Payer: BC Managed Care – PPO | Source: Ambulatory Visit | Attending: Obstetrics & Gynecology | Admitting: Obstetrics & Gynecology

## 2016-06-27 ENCOUNTER — Ambulatory Visit (HOSPITAL_COMMUNITY): Payer: BC Managed Care – PPO | Admitting: Anesthesiology

## 2016-06-27 DIAGNOSIS — K219 Gastro-esophageal reflux disease without esophagitis: Secondary | ICD-10-CM | POA: Diagnosis not present

## 2016-06-27 DIAGNOSIS — Z9013 Acquired absence of bilateral breasts and nipples: Secondary | ICD-10-CM | POA: Insufficient documentation

## 2016-06-27 DIAGNOSIS — N840 Polyp of corpus uteri: Secondary | ICD-10-CM | POA: Diagnosis present

## 2016-06-27 DIAGNOSIS — I10 Essential (primary) hypertension: Secondary | ICD-10-CM | POA: Insufficient documentation

## 2016-06-27 DIAGNOSIS — Z86010 Personal history of colonic polyps: Secondary | ICD-10-CM | POA: Diagnosis not present

## 2016-06-27 DIAGNOSIS — Z853 Personal history of malignant neoplasm of breast: Secondary | ICD-10-CM | POA: Diagnosis not present

## 2016-06-27 DIAGNOSIS — E785 Hyperlipidemia, unspecified: Secondary | ICD-10-CM | POA: Insufficient documentation

## 2016-06-27 DIAGNOSIS — I341 Nonrheumatic mitral (valve) prolapse: Secondary | ICD-10-CM | POA: Insufficient documentation

## 2016-06-27 DIAGNOSIS — F17210 Nicotine dependence, cigarettes, uncomplicated: Secondary | ICD-10-CM | POA: Diagnosis not present

## 2016-06-27 HISTORY — PX: DILATATION & CURETTAGE/HYSTEROSCOPY WITH MYOSURE: SHX6511

## 2016-06-27 SURGERY — DILATATION & CURETTAGE/HYSTEROSCOPY WITH MYOSURE
Anesthesia: General | Site: Vagina

## 2016-06-27 MED ORDER — FENTANYL CITRATE (PF) 100 MCG/2ML IJ SOLN: 25.0000 ug | INTRAMUSCULAR | Status: DC | PRN

## 2016-06-27 MED ORDER — FENTANYL CITRATE (PF) 100 MCG/2ML IJ SOLN
INTRAMUSCULAR | Status: AC
  Filled 2016-06-27: qty 4

## 2016-06-27 MED ORDER — OXYCODONE-ACETAMINOPHEN 7.5-325 MG PO TABS: 1.0000 | ORAL_TABLET | ORAL | 0 refills | Status: DC | PRN

## 2016-06-27 MED ORDER — FENTANYL CITRATE (PF) 250 MCG/5ML IJ SOLN
INTRAMUSCULAR | Status: DC | PRN
  Administered 2016-06-27 (×3): 50 ug via INTRAVENOUS

## 2016-06-27 MED ORDER — SODIUM CHLORIDE 0.9 % IR SOLN
Status: DC | PRN
  Administered 2016-06-27: 3000 mL

## 2016-06-27 MED ORDER — ONDANSETRON HCL 4 MG/2ML IJ SOLN
INTRAMUSCULAR | Status: DC | PRN
  Administered 2016-06-27: 4 mg via INTRAVENOUS

## 2016-06-27 MED ORDER — GLYCOPYRROLATE 0.2 MG/ML IJ SOLN
INTRAMUSCULAR | Status: DC | PRN
  Administered 2016-06-27: 0.1 mg via INTRAVENOUS

## 2016-06-27 MED ORDER — LIDOCAINE HCL (CARDIAC) 20 MG/ML IV SOLN
INTRAVENOUS | Status: DC | PRN
  Administered 2016-06-27: 60 mg via INTRAVENOUS

## 2016-06-27 MED ORDER — OXYCODONE HCL 5 MG/5ML PO SOLN: 5.0000 mg | Freq: Once | ORAL | Status: DC | PRN

## 2016-06-27 MED ORDER — KETOROLAC TROMETHAMINE 30 MG/ML IJ SOLN
INTRAMUSCULAR | Status: DC | PRN
  Administered 2016-06-27: 30 mg via INTRAVENOUS

## 2016-06-27 MED ORDER — LIDOCAINE HCL (CARDIAC) 20 MG/ML IV SOLN
INTRAVENOUS | Status: AC
  Filled 2016-06-27: qty 5

## 2016-06-27 MED ORDER — SILVER NITRATE-POT NITRATE 75-25 % EX MISC
CUTANEOUS | Status: DC | PRN
  Administered 2016-06-27: 2 via TOPICAL

## 2016-06-27 MED ORDER — ONDANSETRON HCL 4 MG/2ML IJ SOLN: 4.0000 mg | Freq: Four times a day (QID) | INTRAMUSCULAR | Status: DC | PRN

## 2016-06-27 MED ORDER — CEFAZOLIN SODIUM-DEXTROSE 2-4 GM/100ML-% IV SOLN
2.0000 g | INTRAVENOUS | Status: AC
  Administered 2016-06-27: 2 g via INTRAVENOUS

## 2016-06-27 MED ORDER — DEXAMETHASONE SODIUM PHOSPHATE 10 MG/ML IJ SOLN
INTRAMUSCULAR | Status: AC
  Filled 2016-06-27: qty 1

## 2016-06-27 MED ORDER — DEXAMETHASONE SODIUM PHOSPHATE 4 MG/ML IJ SOLN
INTRAMUSCULAR | Status: DC | PRN
  Administered 2016-06-27: 10 mg via INTRAVENOUS

## 2016-06-27 MED ORDER — SCOPOLAMINE 1 MG/3DAYS TD PT72
1.0000 | MEDICATED_PATCH | Freq: Once | TRANSDERMAL | Status: DC
Start: 2016-06-27 — End: 2016-06-27
  Administered 2016-06-27: 1.5 mg via TRANSDERMAL

## 2016-06-27 MED ORDER — MIDAZOLAM HCL 2 MG/2ML IJ SOLN
INTRAMUSCULAR | Status: AC
  Filled 2016-06-27: qty 2

## 2016-06-27 MED ORDER — PROPOFOL 10 MG/ML IV BOLUS
INTRAVENOUS | Status: AC
  Filled 2016-06-27: qty 20

## 2016-06-27 MED ORDER — OXYCODONE HCL 5 MG PO TABS: 5.0000 mg | ORAL_TABLET | Freq: Once | ORAL | Status: DC | PRN

## 2016-06-27 MED ORDER — PHENYLEPHRINE HCL 10 MG/ML IJ SOLN
INTRAMUSCULAR | Status: DC | PRN
Start: 2016-06-27 — End: 2016-06-27
  Administered 2016-06-27: 80 ug via INTRAVENOUS
  Administered 2016-06-27: 40 ug via INTRAVENOUS

## 2016-06-27 MED ORDER — ONDANSETRON HCL 4 MG/2ML IJ SOLN
INTRAMUSCULAR | Status: AC
  Filled 2016-06-27: qty 2

## 2016-06-27 MED ORDER — SCOPOLAMINE 1 MG/3DAYS TD PT72
MEDICATED_PATCH | TRANSDERMAL | Status: AC
  Administered 2016-06-27: 1.5 mg via TRANSDERMAL
  Filled 2016-06-27: qty 1

## 2016-06-27 MED ORDER — PROPOFOL 10 MG/ML IV BOLUS
INTRAVENOUS | Status: DC | PRN
  Administered 2016-06-27: 200 mg via INTRAVENOUS

## 2016-06-27 MED ORDER — MIDAZOLAM HCL 2 MG/2ML IJ SOLN
INTRAMUSCULAR | Status: DC | PRN
  Administered 2016-06-27 (×2): 1 mg via INTRAVENOUS

## 2016-06-27 MED ORDER — LACTATED RINGERS IV SOLN
INTRAVENOUS | Status: DC
  Administered 2016-06-27: 09:00:00 via INTRAVENOUS

## 2016-06-27 MED ORDER — CHLOROPROCAINE HCL 1 % IJ SOLN
INTRAMUSCULAR | Status: AC
  Filled 2016-06-27: qty 30

## 2016-06-27 MED ORDER — CHLOROPROCAINE HCL 1 % IJ SOLN
INTRAMUSCULAR | Status: DC | PRN
  Administered 2016-06-27: 20 mL

## 2016-06-27 SURGICAL SUPPLY — 20 items
CANISTER SUCT 3000ML (MISCELLANEOUS) ×3 IMPLANT
CATH ROBINSON RED A/P 16FR (CATHETERS) ×3 IMPLANT
CLOTH BEACON ORANGE TIMEOUT ST (SAFETY) ×3 IMPLANT
CONTAINER PREFILL 10% NBF 60ML (FORM) ×4 IMPLANT
DEVICE MYOSURE LITE (MISCELLANEOUS) IMPLANT
DEVICE MYOSURE REACH (MISCELLANEOUS) ×2 IMPLANT
ELECT REM PT RETURN 9FT ADLT (ELECTROSURGICAL) ×3
ELECTRODE REM PT RTRN 9FT ADLT (ELECTROSURGICAL) ×1 IMPLANT
FILTER ARTHROSCOPY CONVERTOR (FILTER) ×3 IMPLANT
GLOVE BIO SURGEON STRL SZ 6.5 (GLOVE) ×2 IMPLANT
GLOVE BIO SURGEONS STRL SZ 6.5 (GLOVE) ×1
GLOVE BIOGEL PI IND STRL 7.0 (GLOVE) ×2 IMPLANT
GLOVE BIOGEL PI INDICATOR 7.0 (GLOVE) ×4
GOWN STRL REUS W/TWL LRG LVL3 (GOWN DISPOSABLE) ×6 IMPLANT
PACK VAGINAL MINOR WOMEN LF (CUSTOM PROCEDURE TRAY) ×3 IMPLANT
PAD OB MATERNITY 4.3X12.25 (PERSONAL CARE ITEMS) ×3 IMPLANT
SEAL ROD LENS SCOPE MYOSURE (ABLATOR) ×3 IMPLANT
TOWEL OR 17X24 6PK STRL BLUE (TOWEL DISPOSABLE) ×6 IMPLANT
TUBING AQUILEX INFLOW (TUBING) ×3 IMPLANT
TUBING AQUILEX OUTFLOW (TUBING) ×3 IMPLANT

## 2016-06-27 NOTE — Discharge Instructions (Addendum)
DISCHARGE INSTRUCTIONS: D&C / D&E The following instructions have been prepared to help you care for yourself upon your return home.   Personal hygiene:  Use sanitary pads for vaginal drainage, not tampons.  Shower the day after your procedure.  NO tub baths, pools or Jacuzzis for 2-3 weeks.  Wipe front to back after using the bathroom.  Activity and limitations:  Do NOT drive or operate any equipment for 24 hours. The effects of anesthesia are still present and drowsiness may result.  Do NOT rest in bed all day.  Walking is encouraged.  Walk up and down stairs slowly.  You may resume your normal activity in one to two days or as indicated by your physician.  Sexual activity: NO intercourse for at least 2 weeks after the procedure, or as indicated by your physician.  Diet: Eat a light meal as desired this evening. You may resume your usual diet tomorrow.  Return to work: You may resume your work activities in one to two days or as indicated by your doctor.  What to expect after your surgery: Expect to have vaginal bleeding/discharge for 2-3 days and spotting for up to 10 days. It is not unusual to have soreness for up to 1-2 weeks. You may have a slight burning sensation when you urinate for the first day. Mild cramps may continue for a couple of days. You may have a regular period in 2-6 weeks.  Call your doctor for any of the following:  Excessive vaginal bleeding, saturating and changing one pad every hour.  Inability to urinate 6 hours after discharge from hospital.  Pain not relieved by pain medication.  Fever of 100.4 F or greater.  Unusual vaginal discharge or odor.   Call for an appointment:    Patients signature: ______________________  Nurses signature ________________________  Support person's signature_______________________   Hysteroscopy, Care After Refer to this sheet in the next few weeks. These instructions provide you with information on  caring for yourself after your procedure. Your health care provider may also give you more specific instructions. Your treatment has been planned according to current medical practices, but problems sometimes occur. Call your health care provider if you have any problems or questions after your procedure.  WHAT TO EXPECT AFTER THE PROCEDURE After your procedure, it is typical to have the following:  You may have some cramping. This normally lasts for a couple days.  You may have bleeding. This can vary from light spotting for a few days to menstrual-like bleeding for 3-7 days. HOME CARE INSTRUCTIONS  Rest for the first 1-2 days after the procedure.  Only take over-the-counter or prescription medicines as directed by your health care provider. Do not take aspirin. It can increase the chances of bleeding.  Take showers instead of baths for 2 weeks or as directed by your health care provider.  Do not drive for 24 hours or as directed.  Do not drink alcohol while taking pain medicine.  Do not use tampons, douche, or have sexual intercourse for 2 weeks or until your health care provider says it is okay.  Take your temperature twice a day for 4-5 days. Write it down each time.  Follow your health care provider's advice about diet, exercise, and lifting.  If you develop constipation, you may:  Take a mild laxative if your health care provider approves.  Add bran foods to your diet.  Drink enough fluids to keep your urine clear or pale yellow.  Try to have  someone with you or available to you for the first 24-48 hours, especially if you were given a general anesthetic.  Follow up with your health care provider as directed. SEEK MEDICAL CARE IF:  You feel dizzy or lightheaded.  You feel sick to your stomach (nauseous).  You have abnormal vaginal discharge.  You have a rash.  You have pain that is not controlled with medicine. SEEK IMMEDIATE MEDICAL CARE IF:  You have bleeding  that is heavier than a normal menstrual period.  You have a fever.  You have increasing cramps or pain, not controlled with medicine.  You have new belly (abdominal) pain.  You pass out.  You have pain in the tops of your shoulders (shoulder strap areas).  You have shortness of breath. This information is not intended to replace advice given to you by your health care provider. Make sure you discuss any questions you have with your health care provider. Document Released: 03/31/2013 Document Reviewed: 03/31/2013 Elsevier Interactive Patient Education  2017 [employer].

## 2016-06-27 NOTE — Discharge Summary (Signed)
Physician Discharge Summary  Patient ID: Jessica Dean MRN: [id number] DOB/AGE: 55-Apr-1962 55 y.o.  Admit date: 06/27/2016 Discharge date: 06/27/2016  Admission Diagnoses: Intrauterine Polyps  Discharge Diagnoses: Intrauterine Polyps , Intrauterine adhesion        Active Problems:   * No active hospital problems. *   Discharged Condition: good  Hospital Course: Outpatient  Consults: None  Treatments: surgery: Hysteroscopy with Myosure resection of polyps, lysis of adhesion, D+C  Disposition: Home   Allergies as of 06/27/2016      Reactions   Iohexol Hives    Code: HIVES, Desc: developed hives after injection   Neosporin [neomycin-bacitracin Zn-polymyx] Rash   Tape Rash      Medication List    TAKE these medications   ferrous sulfate 325 (65 FE) MG tablet Take 325 mg by mouth daily with breakfast.   omeprazole 40 MG capsule Commonly known as:  PRILOSEC TAKE ONE CAPSULE BY MOUTH DAILY   oxyCODONE-acetaminophen 7.5-325 MG tablet Commonly known as:  PERCOCET Take 1 tablet by mouth every 4 (four) hours as needed for severe pain.   tamoxifen 20 MG tablet Commonly known as:  NOLVADEX Take 20 mg by mouth daily.      Follow-up Information    Princess Bruins, MD Follow up in 3 week(s).   Specialty:  Obstetrics and Gynecology Contact information: [address] [place] [address] [phone]           Signed: Princess Bruins, MD 06/27/2016, 10:41 AM

## 2016-06-27 NOTE — H&P (Signed)
Jessica Dean is an 56 y.o. female [place]  H/O Breast Ca, Tamoxifen d/ced 03/2016  RP:  PMB with 2 IU lesions c/w polyps  Pertinent Gynecological History:  Blood transfusions: none Sexually transmitted diseases: no past history Last mammogram: normal  Last pap: normal  OB History: G3P2A1   Menstrual History:  Patient's last menstrual period was 06/02/2012.    Past Medical History:  Diagnosis Date  . Achalasia   . Anemia   . Breast cancer ([place]) 08/2006   left T1bN0Mx stage 1- s/p bilateral mastectomy TRAM flap reconstruction  . Colon polyps    TUBULAR ADENOMAS (X2) AND HYPERPLASTIC POLYP(S  . GERD (gastroesophageal reflux disease)   . High risk medication use   . Hyperlipidemia   . Hypertension    no medication needed for 10-15 years  . MVP (mitral valve prolapse)   . Tinea pedis     Past Surgical History:  Procedure Laterality Date  . APPENDECTOMY    . BREAST SURGERY    . COLONOSCOPY  07/08/2012  . hellermyotomy     esophagus  . MASTECTOMY  11/12/2006   bilateral, TRAM flap reconstruction  . UPPER GASTROINTESTINAL ENDOSCOPY  07/08/2012    Family History  Problem Relation Age of Onset  . Hypertension Mother   . Breast cancer Mother 40    died age 55  . Hypertension Paternal Grandfather   . Hypertension Maternal Grandfather   . Cancer Maternal Grandfather     Social History:  reports that she has been smoking Cigarettes.  She has a 35.00 pack-year smoking history. She has never used smokeless tobacco. She reports that she drinks alcohol. She reports that she does not use drugs.  Allergies:  Allergies  Allergen Reactions  . Iohexol Hives     Code: HIVES, Desc: developed hives after injection   . Neosporin [Neomycin-Bacitracin Zn-Polymyx] Rash  . Tape Rash    Prescriptions Prior to Admission  Medication Sig Dispense Refill Last Dose  . ferrous sulfate 325 (65 FE) MG tablet Take 325 mg by mouth daily with breakfast.   Past Month at Unknown time   . omeprazole (PRILOSEC) 40 MG capsule TAKE ONE CAPSULE BY MOUTH DAILY 90 capsule 0 06/27/2016 at 07:15  . tamoxifen (NOLVADEX) 20 MG tablet Take 20 mg by mouth daily.   06/26/2016 at 23:45    ROS Neg  Blood pressure (!) 151/77, pulse 74, temperature 97.7 F (36.5 C), temperature source Oral, resp. rate 18, last menstrual period 06/02/2012, SpO2 97 %. Physical Exam   Sonohysto:  2 IU lesions and possible Septation. EBx benign  Assessment/Plan: PMB with IU lesions c/w 2 Polyps 3+ and 2+ cm.  IU Septation?  Informed consent obtained for [facility] Myosure resection, D+C.  Surgery and risks reviewed.  Jessica Dean 06/27/2016, 8:26 AM

## 2016-06-27 NOTE — Anesthesia Procedure Notes (Signed)
Procedure Name: LMA Insertion Date/Time: 06/27/2016 9:45 AM Performed by: Flossie Dibble Pre-anesthesia Checklist: Patient identified, Patient being monitored, Timeout performed and Suction available Patient Re-evaluated:Patient Re-evaluated prior to inductionOxygen Delivery Method: Circle system utilized Preoxygenation: Pre-oxygenation with 100% oxygen Intubation Type: IV induction LMA: LMA inserted LMA Size: 4.0 Number of attempts: 1 Placement Confirmation: positive ETCO2 and breath sounds checked- equal and bilateral Tube secured with: Tape Dental Injury: Teeth and Oropharynx as per pre-operative assessment

## 2016-06-27 NOTE — Anesthesia Preprocedure Evaluation (Signed)
Anesthesia Evaluation  Patient identified by MRN, date of birth, ID band Patient awake    Reviewed: Allergy & Precautions, H&P , NPO status , Patient's Chart, lab work & pertinent test results  Airway Mallampati: II   Neck ROM: full    Dental   Pulmonary Current Smoker,    breath sounds clear to auscultation       Cardiovascular hypertension,  Rhythm:regular Rate:Normal     Neuro/Psych    GI/Hepatic GERD  ,  Endo/Other    Renal/GU      Musculoskeletal   Abdominal   Peds  Hematology   Anesthesia Other Findings   Reproductive/Obstetrics                             Anesthesia Physical Anesthesia Plan  ASA: II  Anesthesia Plan: General   Post-op Pain Management:    Induction: Intravenous  Airway Management Planned: LMA  Additional Equipment:   Intra-op Plan:   Post-operative Plan:   Informed Consent: I have reviewed the patients History and Physical, chart, labs and discussed the procedure including the risks, benefits and alternatives for the proposed anesthesia with the patient or authorized representative who has indicated his/her understanding and acceptance.     Plan Discussed with: CRNA, Anesthesiologist and Surgeon  Anesthesia Plan Comments:         Anesthesia Quick Evaluation

## 2016-06-27 NOTE — Transfer of Care (Signed)
Immediate Anesthesia Transfer of Care Note  Patient: Jessica Dean  Procedure(s) Performed: Procedure(s): DILATATION & CURETTAGE/HYSTEROSCOPY WITH MYOSURE (N/A)  Patient Location: PACU  Anesthesia Type:General  Level of Consciousness: awake, alert  and oriented  Airway & Oxygen Therapy: Patient Spontanous Breathing and Patient connected to nasal cannula oxygen  Post-op Assessment: Report given to RN and Post -op Vital signs reviewed and stable  Post vital signs: Reviewed and stable  Last Vitals:  Vitals:   06/27/16 0820  BP: (!) 151/77  Pulse: 74  Resp: 18  Temp: 36.5 C    Last Pain:  Vitals:   06/27/16 0820  TempSrc: Oral      Patients Stated Pain Goal: 3 ([id number])  Complications: No apparent anesthesia complications

## 2016-06-27 NOTE — Op Note (Signed)
06/27/2016  10:28 AM  PATIENT:  Jessica Dean  56 y.o. female  PRE-OPERATIVE DIAGNOSIS:  Intrauterine Polyps  POST-OPERATIVE DIAGNOSIS:  Intrauterine Polyps, Intrauterine Adhesion  PROCEDURE:  Procedure(s): DILATATION & CURETTAGE/HYSTEROSCOPY WITH MYOSURE RESECTION OF POLYPS AND LYSIS OF ADHESION  SURGEON:  Surgeon(s): Princess Bruins, MD  ASSISTANTS: none   ANESTHESIA:   general   PROCEDURE:  Under general anesthesia with laryngeal mask the patient is in lithotomy position. She is prepped with Betadine on the suprapubic, vulvar and vaginal areas. She is draped as usual. The vaginal exam reveals an anteverted uterus normal volume no adnexal mass. The speculum is inserted in the vagina and the anterior lip of the cervix is grasped with a tenaculum. Dilation of the cervix with Hegar dilators up to #23 without difficulty. We inserted the hysteroscope inside the intrauterine cavity. Pictures are taken. 2 large endometrial polyps are seen. A small intrauterine adhesion between the anterior and posterior wall of the uterus is seen in the midline. Both ostia are well visualized. We used the Myosure instrument to resect the endometrial polyps and lyse the adhesion.  The endometrium is mildly thickened and the Myosure instrument is used to resect the areas of thickness.  Pictures are taken after resection. We remove the hysteroscope with the Myosure instrument. We proceed with a systematic curettage of the intrauterine cavity on all surfaces with a sharp curette. Both specimens are sent together to pathology.  The tenaculum is removed from the anterior lip of the cervix. Silver nitrate is used at that level to control hemostasis. Hemostasis is adequate. The speculum is removed. The patient is brought to recovery room in good and stable status.   ESTIMATED BLOOD LOSS: 20 cc FLUID DEFICIT 320 cc   Intake/Output Summary (Last 24 hours) at 06/27/16 1028 Last data filed at 06/27/16 1024  Gross per  24 hour  Intake             1000 ml  Output               70 ml  Net              930 ml     BLOOD ADMINISTERED:none   LOCAL MEDICATIONS USED:  Nesacaine 1% 20 cc Paracervical block  SPECIMEN:  Source of Specimen:  Endometrial curettings and resection material from polyps  DISPOSITION OF SPECIMEN:  Pathology  COUNTS:  YES  PLAN OF CARE: Transfer to PACU  Princess Bruins MD  06/27/2016 at 10:32 am

## 2016-06-28 ENCOUNTER — Encounter (HOSPITAL_COMMUNITY): Payer: Self-pay | Admitting: Obstetrics & Gynecology

## 2016-06-28 NOTE — Anesthesia Postprocedure Evaluation (Signed)
Anesthesia Post Note  Patient: Jessica Dean  Procedure(s) Performed: Procedure(s) (LRB): DILATATION & CURETTAGE/HYSTEROSCOPY WITH MYOSURE (N/A)  Patient location during evaluation: PACU Anesthesia Type: General Level of consciousness: awake and alert and patient cooperative Pain management: pain level controlled Vital Signs Assessment: post-procedure vital signs reviewed and stable Respiratory status: spontaneous breathing and respiratory function stable Cardiovascular status: stable Anesthetic complications: no       Last Vitals:  Vitals:   06/27/16 1145 06/27/16 1228  BP:  136/71  Pulse:  (!) 53  Resp:  16  Temp: 36.6 C 36.4 C    Last Pain:  Vitals:   06/27/16 1145  TempSrc:   PainSc: 0-No pain                 Raychelle Hudman S

## 2016-11-28 ENCOUNTER — Encounter: Payer: Self-pay | Admitting: Family Medicine

## 2016-11-28 ENCOUNTER — Ambulatory Visit (INDEPENDENT_AMBULATORY_CARE_PROVIDER_SITE_OTHER): Payer: BC Managed Care – PPO | Admitting: Family Medicine

## 2016-11-28 VITALS — BP 168/100 | HR 86 | Temp 97.9°F | Resp 16 | Ht 66.0 in | Wt 183.0 lb

## 2016-11-28 DIAGNOSIS — Z0000 Encounter for general adult medical examination without abnormal findings: Secondary | ICD-10-CM | POA: Diagnosis not present

## 2016-11-28 DIAGNOSIS — R030 Elevated blood-pressure reading, without diagnosis of hypertension: Secondary | ICD-10-CM

## 2016-11-28 DIAGNOSIS — F17200 Nicotine dependence, unspecified, uncomplicated: Secondary | ICD-10-CM | POA: Diagnosis not present

## 2016-11-28 LAB — COMPLETE METABOLIC PANEL WITH GFR
ALBUMIN: 3.9 g/dL (ref 3.6–5.1)
ALK PHOS: 51 U/L (ref 33–130)
ALT: 15 U/L (ref 6–29)
AST: 20 U/L (ref 10–35)
BILIRUBIN TOTAL: 0.5 mg/dL (ref 0.2–1.2)
BUN: 12 mg/dL (ref 7–25)
CO2: 24 mmol/L (ref 20–31)
CREATININE: 0.77 mg/dL (ref 0.50–1.05)
Calcium: 9.1 mg/dL (ref 8.6–10.4)
Chloride: 109 mmol/L (ref 98–110)
GFR, EST NON AFRICAN AMERICAN: 88 mL/min (ref >=60)
GLUCOSE: 95 mg/dL (ref 70–99)
Potassium: 4.8 mmol/L (ref 3.5–5.3)
Sodium: 139 mmol/L (ref 135–146)
TOTAL PROTEIN: 6.9 g/dL (ref 6.1–8.1)

## 2016-11-28 LAB — CBC WITH DIFFERENTIAL/PLATELET
Basophils Absolute: 64 cells/uL (ref 0–200)
Basophils Relative: 1 %
EOS ABS: 192 {cells}/uL (ref 15–500)
Eosinophils Relative: 3 %
HEMATOCRIT: 39.6 % (ref 35.0–45.0)
HEMOGLOBIN: 12.7 g/dL (ref 12.0–15.0)
LYMPHS ABS: 2048 {cells}/uL (ref 850–3900)
Lymphocytes Relative: 32 %
MCH: 29 pg (ref 27.0–33.0)
MCHC: 32.1 g/dL (ref 32.0–36.0)
MCV: 90.4 fL (ref 80.0–100.0)
MONO ABS: 512 {cells}/uL (ref 200–950)
MPV: 9.8 fL (ref 7.5–12.5)
Monocytes Relative: 8 %
Neutro Abs: 3584 cells/uL (ref 1500–7800)
Neutrophils Relative %: 56 %
Platelets: 311 10*3/uL (ref 140–400)
RBC: 4.38 MIL/uL (ref 3.80–5.10)
RDW: 14.6 % (ref 11.0–15.0)
WBC: 6.4 10*3/uL (ref 3.8–10.8)

## 2016-11-28 LAB — LIPID PANEL
Cholesterol: 262 mg/dL — ABNORMAL HIGH (ref <200)
HDL: 77 mg/dL (ref >50)
LDL Cholesterol: 165 mg/dL — ABNORMAL HIGH (ref <100)
TRIGLYCERIDES: 100 mg/dL (ref <150)
Total CHOL/HDL Ratio: 3.4 Ratio (ref <5.0)
VLDL: 20 mg/dL (ref <30)

## 2016-11-28 NOTE — Progress Notes (Signed)
Subjective:    Patient ID: Jessica Dean, female    DOB: Aug 24, 1960, 56 y.o.   MRN: [id number]  HPI Patient is a very pleasant 56 year old white female with a history of breast cancer status post bilateral mastectomy who is now 10 years cancer free and is still taking tamoxifen. She sees an oncologist at [facility]. She is still having annual mammograms due to a trans-flap reconstructive surgery that left residual tissue. Last colonoscopy was in 2014 and is due again in 2019. Pap smear is performed annually at her gynecologist. Unfortunately the patient still smokes and her blood pressure is significantly high today. She is seen numerous doctors over the last 5 years and has never been told her blood pressures that high. She believes some of this could be a false positive and would like to monitor her blood pressure at home over the next 2 weeks prior to instituting medication. She does have a previous history of hypertension and hyperlipidemia but was able to discontinue medications due to weight loss. Past Medical History:  Diagnosis Date  . Achalasia   . Anemia   . Breast cancer ([place]) 08/2006   left T1bN0Mx stage 1- s/p bilateral mastectomy TRAM flap reconstruction  . Colon polyps    TUBULAR ADENOMAS (X2) AND HYPERPLASTIC POLYP(S  . GERD (gastroesophageal reflux disease)   . High risk medication use   . Hyperlipidemia   . Hypertension    no medication needed for 10-15 years  . MVP (mitral valve prolapse)   . Tinea pedis    Past Surgical History:  Procedure Laterality Date  . APPENDECTOMY    . BREAST SURGERY    . COLONOSCOPY  07/08/2012  . DILATATION & CURETTAGE/HYSTEROSCOPY WITH MYOSURE N/A 06/27/2016   Procedure: DILATATION & CURETTAGE/HYSTEROSCOPY WITH MYOSURE;  Surgeon: Princess Bruins, MD;  Location: [facility];  Service: Gynecology;  Laterality: N/A;  . hellermyotomy     esophagus  . MASTECTOMY  11/12/2006   bilateral, TRAM flap reconstruction  . UPPER GASTROINTESTINAL  ENDOSCOPY  07/08/2012   Current Outpatient Prescriptions on File Prior to Visit  Medication Sig Dispense Refill  . omeprazole (PRILOSEC) 40 MG capsule TAKE ONE CAPSULE BY MOUTH DAILY 90 capsule 0  . tamoxifen (NOLVADEX) 20 MG tablet Take 20 mg by mouth daily.     No current facility-administered medications on file prior to visit.    Allergies  Allergen Reactions  . Iohexol Hives     Code: HIVES, Desc: developed hives after injection   . Neosporin [Neomycin-Bacitracin Zn-Polymyx] Rash  . Tape Rash   Social History   Social History  . Marital status: Legally Separated    Spouse name: N/A  . Number of children: 2  . Years of education: N/A   Occupational History  . 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  [employer]   Social History Main Topics  . Smoking status: Current Every Day Smoker    Packs/day: 1.00    Years: 35.00    Types: Cigarettes  . Smokeless tobacco: Never Used  . Alcohol use Yes  . Drug use: No  . Sexual activity: Not on file   Other Topics Concern  . Not on file   Social History Narrative   Currently separated as of January 2014   2 daughters   Earnie Larsson as a Solicitor for [employer]   Family History  Problem Relation Age of Onset  . Hypertension Mother   . Breast cancer  Mother 68       died age 53  . Hypertension Paternal Grandfather   . Hypertension Maternal Grandfather   . Cancer Maternal Grandfather       Review of Systems  All other systems reviewed and are negative.      Objective:   Physical Exam  Constitutional: She is oriented to person, place, and time. She appears well-developed and well-nourished. No distress.  HENT:  Head: Normocephalic and atraumatic.  Right Ear: External ear normal.  Left Ear: External ear normal.  Nose: Nose normal.  Mouth/Throat: Oropharynx is clear and moist. No oropharyngeal exudate.  Eyes: Conjunctivae and EOM are normal. Pupils are equal, round, and reactive to light.  Right eye exhibits no discharge. Left eye exhibits no discharge. No scleral icterus.  Neck: Normal range of motion. Neck supple. No JVD present. No tracheal deviation present. No thyromegaly present.  Cardiovascular: Normal rate, regular rhythm, normal heart sounds and intact distal pulses.  Exam reveals no gallop and no friction rub.   No murmur heard. Pulmonary/Chest: Effort normal and breath sounds normal. No stridor. No respiratory distress. She has no wheezes. She has no rales. She exhibits no tenderness.  Abdominal: Soft. Bowel sounds are normal. She exhibits no distension and no mass. There is no tenderness. There is no rebound and no guarding.  Musculoskeletal: Normal range of motion. She exhibits no edema, tenderness or deformity.  Lymphadenopathy:    She has no cervical adenopathy.  Neurological: She is alert and oriented to person, place, and time. She has normal reflexes. She displays normal reflexes. No cranial nerve deficit. She exhibits normal muscle tone. Coordination normal.  Skin: Skin is warm. No rash noted. She is not diaphoretic. No erythema. No pallor.  Psychiatric: She has a normal mood and affect. Her behavior is normal. Judgment and thought content normal.  Vitals reviewed.         Assessment & Plan:  General medical exam - Plan: CBC with Differential/Platelet, COMPLETE METABOLIC PANEL WITH GFR, Lipid panel, TSH  Physical exam today is significant for smoking as well as hypertension. Patient will monitor her blood pressure daily for the next 2 weeks and report the values to me. Goal blood pressure is less than 140/90. Recommended smoking cessation but the patient is in the pre-contemplative phase and has no desire to try at the present time. Cancer screening is up-to-date. We'll check a CBC, CMP, fasting lipid panel, and TSH. Colonoscopy is due next year. Mammogram is performed at her gynecologist. Pap smears performed at her gynecologist.

## 2016-11-29 ENCOUNTER — Other Ambulatory Visit: Payer: Self-pay | Admitting: Family Medicine

## 2016-11-29 LAB — TSH: TSH: 1.68 m[IU]/L

## 2016-11-29 MED ORDER — PRAVASTATIN SODIUM 40 MG PO TABS: 40.0000 mg | ORAL_TABLET | Freq: Every day | ORAL | 1 refills | Status: DC

## 2016-11-29 MED ORDER — OMEPRAZOLE 40 MG PO CPDR
40.0000 mg | DELAYED_RELEASE_CAPSULE | Freq: Every day | ORAL | 3 refills | Status: DC
Start: 2016-11-29 — End: 2017-10-02

## 2016-12-03 ENCOUNTER — Telehealth: Payer: Self-pay | Admitting: Family Medicine

## 2016-12-03 NOTE — Telephone Encounter (Signed)
Patient calling to speak with you regarding her recent blood pressure reading  [phone]

## 2016-12-03 NOTE — Telephone Encounter (Signed)
Called pt and unable to leave message vm full

## 2016-12-05 NOTE — Telephone Encounter (Signed)
Spoke to pt and she states that she had her BP checked at oncology and it was 179/89. She thinks maybe she may need to be on something for this now. (?) She has been unable to ck BP's at home cause her BP cuff is not working so that is the only other reading she has.

## 2016-12-06 MED ORDER — LISINOPRIL-HYDROCHLOROTHIAZIDE 20-12.5 MG PO TABS: 1.0000 | ORAL_TABLET | Freq: Every day | ORAL | 3 refills | Status: DC

## 2016-12-06 NOTE — Telephone Encounter (Signed)
zestoretic 20/12.5 poqday and recheck in 1 month.

## 2016-12-06 NOTE — Telephone Encounter (Signed)
Medication called/sent to requested pharmacy and pt aware, appt made for 1 month f/u

## 2017-01-07 ENCOUNTER — Ambulatory Visit (INDEPENDENT_AMBULATORY_CARE_PROVIDER_SITE_OTHER): Payer: BC Managed Care – PPO | Admitting: Family Medicine

## 2017-01-07 ENCOUNTER — Encounter: Payer: Self-pay | Admitting: Family Medicine

## 2017-01-07 VITALS — BP 144/74 | HR 82 | Temp 98.2°F | Resp 18 | Ht 66.0 in | Wt 182.0 lb

## 2017-01-07 DIAGNOSIS — I10 Essential (primary) hypertension: Secondary | ICD-10-CM | POA: Diagnosis not present

## 2017-01-07 NOTE — Progress Notes (Signed)
Subjective:    Patient ID: Jessica Dean, female    DOB: 12/04/1960, 56 y.o.   MRN: [id number]  HPI  11/28/16 Patient is a very pleasant 56 year old white female with a history of breast cancer status post bilateral mastectomy who is now 10 years cancer free and is still taking tamoxifen. She sees an oncologist at [facility]. She is still having annual mammograms due to a trans-flap reconstructive surgery that left residual tissue. Last colonoscopy was in 2014 and is due again in 2019. Pap smear is performed annually at her gynecologist. Unfortunately the patient still smokes and her blood pressure is significantly high today. She is seen numerous doctors over the last 5 years and has never been told her blood pressures that high. She believes some of this could be a false positive and would like to monitor her blood pressure at home over the next 2 weeks prior to instituting medication. She does have a previous history of hypertension and hyperlipidemia but was able to discontinue medications due to weight loss.  At that time, my plan was: Physical exam today is significant for smoking as well as hypertension. Patient will monitor her blood pressure daily for the next 2 weeks and report the values to me. Goal blood pressure is less than 140/90. Recommended smoking cessation but the patient is in the pre-contemplative phase and has no desire to try at the present time. Cancer screening is up-to-date. We'll check a CBC, CMP, fasting lipid panel, and TSH. Colonoscopy is due next year. Mammogram is performed at her gynecologist. Pap smears performed at her gynecologist.  01/07/17  Patient called back 6/12 with elevated blood pressures and we started her on zestoretic 20/12.5 poqday.  I personally rechecked her blood pressure and found to be 134/74. She has not been checking at home. Her fasting lipid panel was significant for an LDL cholesterol of 165. We also started her on pravastatin since her  last visit. Unfortunately she continues to smoke cigarettes Past Medical History:  Diagnosis Date  . Achalasia   . Anemia   . Breast cancer ([place]) 08/2006   left T1bN0Mx stage 1- s/p bilateral mastectomy TRAM flap reconstruction  . Colon polyps    TUBULAR ADENOMAS (X2) AND HYPERPLASTIC POLYP(S  . GERD (gastroesophageal reflux disease)   . High risk medication use   . Hyperlipidemia   . Hypertension    no medication needed for 55-15 years  . MVP (mitral valve prolapse)   . Tinea pedis    Past Surgical History:  Procedure Laterality Date  . APPENDECTOMY    . BREAST SURGERY    . COLONOSCOPY  07/08/2012  . DILATATION & CURETTAGE/HYSTEROSCOPY WITH MYOSURE N/A 06/27/2016   Procedure: DILATATION & CURETTAGE/HYSTEROSCOPY WITH MYOSURE;  Surgeon: Princess Bruins, MD;  Location: [facility];  Service: Gynecology;  Laterality: N/A;  . hellermyotomy     esophagus  . MASTECTOMY  11/12/2006   bilateral, TRAM flap reconstruction  . UPPER GASTROINTESTINAL ENDOSCOPY  07/08/2012   Current Outpatient Prescriptions on File Prior to Visit  Medication Sig Dispense Refill  . letrozole (FEMARA) 2.5 MG tablet Take 2.5 mg by mouth.    Marland Kitchen lisinopril-hydrochlorothiazide (ZESTORETIC) 20-12.5 MG tablet Take 1 tablet by mouth daily. 90 tablet 3  . omeprazole (PRILOSEC) 40 MG capsule Take 1 capsule (40 mg total) by mouth daily. 90 capsule 3  . pravastatin (PRAVACHOL) 40 MG tablet Take 1 tablet (40 mg total) by mouth daily. 90 tablet 1   No current  facility-administered medications on file prior to visit.    Allergies  Allergen Reactions  . Iohexol Hives     Code: HIVES, Desc: developed hives after injection   . Neosporin [Neomycin-Bacitracin Zn-Polymyx] Rash  . Tape Rash   Social History   Social History  . Marital status: Legally Separated    Spouse name: N/A  . Number of children: 2  . Years of education: N/A   Occupational History  . 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  [employer]   Social  History Main Topics  . Smoking status: Current Every Day Smoker    Packs/day: 1.00    Years: 35.00    Types: Cigarettes  . Smokeless tobacco: Never Used  . Alcohol use Yes  . Drug use: No  . Sexual activity: Not on file   Other Topics Concern  . Not on file   Social History Narrative   Currently separated as of January 2014   2 daughters   Earnie Larsson as a Solicitor for [employer]   Family History  Problem Relation Age of Onset  . Hypertension Mother   . Breast cancer Mother 70       died age 21  . Hypertension Paternal Grandfather   . Hypertension Maternal Grandfather   . Cancer Maternal Grandfather       Review of Systems  All other systems reviewed and are negative.      Objective:   Physical Exam  Constitutional: She is oriented to person, place, and time. She appears well-developed and well-nourished. No distress.  HENT:  Head: Normocephalic and atraumatic.  Right Ear: External ear normal.  Left Ear: External ear normal.  Nose: Nose normal.  Mouth/Throat: Oropharynx is clear and moist. No oropharyngeal exudate.  Eyes: Pupils are equal, round, and reactive to light. Conjunctivae and EOM are normal. Right eye exhibits no discharge. Left eye exhibits no discharge. No scleral icterus.  Neck: Normal range of motion. Neck supple. No JVD present. No tracheal deviation present. No thyromegaly present.  Cardiovascular: Normal rate, regular rhythm, normal heart sounds and intact distal pulses.  Exam reveals no gallop and no friction rub.   No murmur heard. Pulmonary/Chest: Effort normal and breath sounds normal. No stridor. No respiratory distress. She has no wheezes. She has no rales. She exhibits no tenderness.  Abdominal: Soft. Bowel sounds are normal. She exhibits no distension and no mass. There is no tenderness. There is no rebound and no guarding.  Musculoskeletal: Normal range of motion. She exhibits no edema, tenderness or deformity.    Lymphadenopathy:    She has no cervical adenopathy.  Neurological: She is alert and oriented to person, place, and time. She has normal reflexes. No cranial nerve deficit. She exhibits normal muscle tone. Coordination normal.  Skin: Skin is warm. No rash noted. She is not diaphoretic. No erythema. No pallor.  Psychiatric: She has a normal mood and affect. Her behavior is normal. Judgment and thought content normal.  Vitals reviewed.         Assessment & Plan:  Essential hypertension  Blood pressure is much better. I've asked the patient to check her blood pressure everyday at home and notify me of the values. Recheck fasting lipid panel in 3 months. Goal LDL cholesterol is less than 100. Strongly encouraged smoking cessation.

## 2017-07-24 ENCOUNTER — Encounter: Payer: Self-pay | Admitting: Internal Medicine

## 2017-10-02 ENCOUNTER — Other Ambulatory Visit: Payer: Self-pay | Admitting: Family Medicine

## 2018-01-05 ENCOUNTER — Other Ambulatory Visit: Payer: Self-pay | Admitting: Family Medicine

## 2018-03-28 ENCOUNTER — Other Ambulatory Visit: Payer: Self-pay | Admitting: Family Medicine

## 2018-04-28 ENCOUNTER — Other Ambulatory Visit: Payer: Self-pay | Admitting: Family Medicine

## 2018-05-15 ENCOUNTER — Ambulatory Visit: Payer: BC Managed Care – PPO | Admitting: Family Medicine

## 2018-05-15 VITALS — BP 152/84 | HR 84 | Temp 97.9°F | Wt 193.0 lb

## 2018-05-15 DIAGNOSIS — K219 Gastro-esophageal reflux disease without esophagitis: Secondary | ICD-10-CM

## 2018-05-15 DIAGNOSIS — I10 Essential (primary) hypertension: Secondary | ICD-10-CM | POA: Diagnosis not present

## 2018-05-15 DIAGNOSIS — E7800 Pure hypercholesterolemia, unspecified: Secondary | ICD-10-CM | POA: Diagnosis not present

## 2018-05-15 LAB — CBC WITH DIFFERENTIAL/PLATELET
BASOS PCT: 0.6 %
Basophils Absolute: 40 cells/uL (ref 0–200)
EOS ABS: 261 {cells}/uL (ref 15–500)
Eosinophils Relative: 3.9 %
HCT: 37.1 % (ref 35.0–45.0)
HEMOGLOBIN: 12.6 g/dL (ref 11.7–15.5)
Lymphs Abs: 2365 cells/uL (ref 850–3900)
MCH: 29.6 pg (ref 27.0–33.0)
MCHC: 34 g/dL (ref 32.0–36.0)
MCV: 87.1 fL (ref 80.0–100.0)
MPV: 10.4 fL (ref 7.5–12.5)
Monocytes Relative: 8.7 %
NEUTROS ABS: 3451 {cells}/uL (ref 1500–7800)
Neutrophils Relative %: 51.5 %
PLATELETS: 357 10*3/uL (ref 140–400)
RBC: 4.26 10*6/uL (ref 3.80–5.10)
RDW: 13.1 % (ref 11.0–15.0)
Total Lymphocyte: 35.3 %
WBC mixed population: 583 cells/uL (ref 200–950)
WBC: 6.7 10*3/uL (ref 3.8–10.8)

## 2018-05-15 LAB — COMPLETE METABOLIC PANEL WITH GFR
AG Ratio: 1.5 (calc) (ref 1.0–2.5)
ALBUMIN MSPROF: 4.3 g/dL (ref 3.6–5.1)
ALT: 17 U/L (ref 6–29)
AST: 17 U/L (ref 10–35)
Alkaline phosphatase (APISO): 59 U/L (ref 33–130)
BUN: 16 mg/dL (ref 7–25)
CALCIUM: 10.7 mg/dL — AB (ref 8.6–10.4)
CHLORIDE: 100 mmol/L (ref 98–110)
CO2: 27 mmol/L (ref 20–32)
CREATININE: 0.75 mg/dL (ref 0.50–1.05)
GFR, EST AFRICAN AMERICAN: 103 mL/min/{1.73_m2} (ref >=60)
GFR, EST NON AFRICAN AMERICAN: 89 mL/min/{1.73_m2} (ref >=60)
Globulin: 2.8 g/dL (calc) (ref 1.9–3.7)
Glucose, Bld: 93 mg/dL (ref 65–99)
Potassium: 4.8 mmol/L (ref 3.5–5.3)
Sodium: 136 mmol/L (ref 135–146)
TOTAL PROTEIN: 7.1 g/dL (ref 6.1–8.1)
Total Bilirubin: 0.5 mg/dL (ref 0.2–1.2)

## 2018-05-15 LAB — LIPID PANEL
CHOL/HDL RATIO: 3.1 (calc) (ref <5.0)
Cholesterol: 263 mg/dL — ABNORMAL HIGH (ref <200)
HDL: 85 mg/dL (ref >50)
LDL CHOLESTEROL (CALC): 158 mg/dL — AB
NON-HDL CHOLESTEROL (CALC): 178 mg/dL — AB (ref <130)
TRIGLYCERIDES: 94 mg/dL (ref <150)

## 2018-05-15 MED ORDER — DEXLANSOPRAZOLE 60 MG PO CPDR: 60.0000 mg | DELAYED_RELEASE_CAPSULE | Freq: Every day | ORAL | 3 refills | Status: DC

## 2018-05-15 MED ORDER — LISINOPRIL-HYDROCHLOROTHIAZIDE 20-12.5 MG PO TABS: 1.0000 | ORAL_TABLET | Freq: Every day | ORAL | 3 refills | Status: DC

## 2018-05-15 NOTE — Progress Notes (Signed)
Subjective:    Patient ID: Jessica Dean, female    DOB: October 03, 1960, 57 y.o.   MRN: [id number]  Hypertension     11/28/16 Patient is a very pleasant 57 year old white female with a history of breast cancer status post bilateral mastectomy who is now 10 years cancer free and is still taking tamoxifen. She sees an oncologist at [facility]. She is still having annual mammograms due to a trans-flap reconstructive surgery that left residual tissue. Last colonoscopy was in 2014 and is due again in 2019. Pap smear is performed annually at her gynecologist. Unfortunately the patient still smokes and her blood pressure is significantly high today. She is seen numerous doctors over the last 5 years and has never been told her blood pressures that high. She believes some of this could be a false positive and would like to monitor her blood pressure at home over the next 2 weeks prior to instituting medication. She does have a previous history of hypertension and hyperlipidemia but was able to discontinue medications due to weight loss.  At that time, my plan was: Physical exam today is significant for smoking as well as hypertension. Patient will monitor her blood pressure daily for the next 2 weeks and report the values to me. Goal blood pressure is less than 140/90. Recommended smoking cessation but the patient is in the pre-contemplative phase and has no desire to try at the present time. Cancer screening is up-to-date. We'll check a CBC, CMP, fasting lipid panel, and TSH. Colonoscopy is due next year. Mammogram is performed at her gynecologist. Pap smears performed at her gynecologist.  01/07/17  Patient called back 6/12 with elevated blood pressures and we started her on zestoretic 20/12.5 poqday.  I personally rechecked her blood pressure and found to be 134/74. She has not been checking at home. Her fasting lipid panel was significant for an LDL cholesterol of 165. We also started her on  pravastatin since her last visit. Unfortunately she continues to smoke cigarettes.  At that time, my plan was:  Blood pressure is much better. I've asked the patient to check her blood pressure everyday at home and notify me of the values. Recheck fasting lipid panel in 3 months. Goal LDL cholesterol is less than 100. Strongly encouraged smoking cessation.  05/15/18 Patient is here today for regular checkup.  She is compliant taking her Zestoretic.  Unfortunate her blood pressure here is 152/84.  She states that she has whitecoat syndrome and that her blood pressures typically elevated in the doctor's office.  She states that when she checks it at home her blood pressure is much better controlled.  She has a blood pressure cuff at home and it has the ability to monitor it.  She denies any chest pain shortness of breath or dyspnea on exertion.  She is quit smoking since I last saw her unfortunately she has started vaping.  I have counseled patient against this as well.  She quit taking her cholesterol medication, pravastatin, last year after experiencing some muscle aches.  She is due today to recheck her cholesterol.  Her mammogram, and Pap smear performed at her gynecologist.  Her colonoscopy is due.  She is already been contacted by her gastroenterologist yet she has not yet made her appointment.  She is taking omeprazole 40 mg every morning.  She is having breakthrough acid reflux particularly in the evenings while lying down in bed.  She is tried elevating the head of her bed  and still is having breakthrough reflux.  She has stomach contents occasionally regurgitate into her upper throat and mouth.  She has "wet burps".  She has a frequent burning sensation in her upper esophagus despite taking a proton pump inhibitor. Past Medical History:  Diagnosis Date  . Achalasia   . Anemia   . Breast cancer ([place]) 08/2006   left T1bN0Mx stage 1- s/p bilateral mastectomy TRAM flap reconstruction  . Colon polyps      TUBULAR ADENOMAS (X2) AND HYPERPLASTIC POLYP(S  . GERD (gastroesophageal reflux disease)   . High risk medication use   . Hyperlipidemia   . Hypertension    no medication needed for 10-15 years  . MVP (mitral valve prolapse)   . Tinea pedis    Past Surgical History:  Procedure Laterality Date  . APPENDECTOMY    . BREAST SURGERY    . COLONOSCOPY  07/08/2012  . DILATATION & CURETTAGE/HYSTEROSCOPY WITH MYOSURE N/A 06/27/2016   Procedure: DILATATION & CURETTAGE/HYSTEROSCOPY WITH MYOSURE;  Surgeon: Princess Bruins, MD;  Location: [facility];  Service: Gynecology;  Laterality: N/A;  . hellermyotomy     esophagus  . MASTECTOMY  11/12/2006   bilateral, TRAM flap reconstruction  . UPPER GASTROINTESTINAL ENDOSCOPY  07/08/2012   Current Outpatient Medications on File Prior to Visit  Medication Sig Dispense Refill  . omeprazole (PRILOSEC) 40 MG capsule TAKE 1 CAPSULE(40 MG) BY MOUTH DAILY 30 capsule 0  . pravastatin (PRAVACHOL) 40 MG tablet Take 1 tablet (40 mg total) by mouth daily. (Patient not taking: Reported on 05/15/2018) 90 tablet 1   No current facility-administered medications on file prior to visit.    Allergies  Allergen Reactions  . Iohexol Hives     Code: HIVES, Desc: developed hives after injection   . Neosporin [Neomycin-Bacitracin Zn-Polymyx] Rash  . Tape Rash   Social History   Socioeconomic History  . Marital status: Legally Separated    Spouse name: Not on file  . Number of children: 2  . Years of education: Not on file  . Highest education level: Not on file  Occupational History  . 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 Financial resource strain: Not on file  . Food insecurity:    Worry: Not on file    Inability: Not on file  . Transportation needs:    Medical: Not on file    Non-medical: Not on file  Tobacco Use  . Smoking status: Current Every Day Smoker    Packs/day: 1.00    Years: 35.00    Pack  years: 35.00    Types: Cigarettes  . Smokeless tobacco: Never Used  Substance and Sexual Activity  . Alcohol use: Yes  . Drug use: No  . Sexual activity: Not on file  Lifestyle  . Physical activity:    Days per week: Not on file    Minutes per session: Not on file  . Stress: Not on file  Relationships  . Social connections:    Talks on phone: Not on file    Gets together: Not on file    Attends religious service: Not on file    Active member of club or organization: Not on file    Attends meetings of clubs or organizations: Not on file    Relationship status: Not on file  . Intimate partner violence:    Fear of current or ex partner: Not on file    Emotionally abused:  Not on file    Physically abused: Not on file    Forced sexual activity: Not on file  Other Topics Concern  . Not on file  Social History Narrative   Currently separated as of January 2014   2 daughters   Earnie Larsson as a Solicitor for [employer]   Family History  Problem Relation Age of Onset  . Hypertension Mother   . Breast cancer Mother 57       died age 84  . Hypertension Paternal Grandfather   . Hypertension Maternal Grandfather   . Cancer Maternal Grandfather       Review of Systems  All other systems reviewed and are negative.      Objective:   Physical Exam  Constitutional: She is oriented to person, place, and time. She appears well-developed and well-nourished. No distress.  HENT:  Head: Normocephalic and atraumatic.  Right Ear: External ear normal.  Left Ear: External ear normal.  Nose: Nose normal.  Mouth/Throat: Oropharynx is clear and moist. No oropharyngeal exudate.  Eyes: Pupils are equal, round, and reactive to light. Conjunctivae and EOM are normal. Right eye exhibits no discharge. Left eye exhibits no discharge. No scleral icterus.  Neck: Normal range of motion. Neck supple. No JVD present. No tracheal deviation present. No thyromegaly present.    Cardiovascular: Normal rate, regular rhythm, normal heart sounds and intact distal pulses. Exam reveals no gallop and no friction rub.  No murmur heard. Pulmonary/Chest: Effort normal and breath sounds normal. No stridor. No respiratory distress. She has no wheezes. She has no rales. She exhibits no tenderness.  Abdominal: Soft. Bowel sounds are normal. She exhibits no distension and no mass. There is no tenderness. There is no rebound and no guarding.  Musculoskeletal: Normal range of motion. She exhibits no edema, tenderness or deformity.  Lymphadenopathy:    She has no cervical adenopathy.  Neurological: She is alert and oriented to person, place, and time. She has normal reflexes. No cranial nerve deficit. She exhibits normal muscle tone. Coordination normal.  Skin: Skin is warm. No rash noted. She is not diaphoretic. No erythema. No pallor.  Psychiatric: She has a normal mood and affect. Her behavior is normal. Judgment and thought content normal.  Vitals reviewed.         Assessment & Plan:  Pure hypercholesterolemia - Plan: CBC with Differential/Platelet, COMPLETE METABOLIC PANEL WITH GFR, Lipid panel  Benign essential HTN  Gastroesophageal reflux disease, esophagitis presence not specified  Patient's blood pressures today is elevated.  I asked the patient to check her blood pressure every day at home and notify me of the values.  If greater than 140/90, I would increase her Zestoretic to 40/25 p.o. Daily.  She is already made an appointment to see her gynecologist for her Pap smear and mammogram.  She is yet to make an appointment for a colonoscopy.  I have encouraged the patient to do this.  I have recommended complete abstinence from smoking and vaping.  Patient's flu shot is already up-to-date.  I will recheck the patient's cholesterol and if still elevated, I would recommend trying Crestor 10 mg a day and recheck fasting lab work in 3 months.  Patient has had breakthrough acid  reflux despite taking a proton pump inhibitor and elevating the head of her bed and trying to avoid causative foods.  Therefore I have recommended discontinuing prilosec and replacing with Dexilant 60 mg p.o. every morning.

## 2018-05-20 MED ORDER — ROSUVASTATIN CALCIUM 10 MG PO TABS: 10.0000 mg | ORAL_TABLET | Freq: Every day | ORAL | 1 refills | Status: DC

## 2018-09-04 ENCOUNTER — Other Ambulatory Visit: Payer: Self-pay

## 2018-09-04 ENCOUNTER — Other Ambulatory Visit: Payer: Self-pay | Admitting: Family Medicine

## 2018-09-04 ENCOUNTER — Encounter: Payer: Self-pay | Admitting: Family Medicine

## 2018-09-04 ENCOUNTER — Ambulatory Visit (INDEPENDENT_AMBULATORY_CARE_PROVIDER_SITE_OTHER): Payer: BC Managed Care – PPO | Admitting: Family Medicine

## 2018-09-04 ENCOUNTER — Telehealth: Payer: Self-pay | Admitting: *Deleted

## 2018-09-04 VITALS — BP 130/80 | HR 86 | Temp 98.1°F | Resp 18 | Ht 66.0 in | Wt 192.0 lb

## 2018-09-04 DIAGNOSIS — Z0000 Encounter for general adult medical examination without abnormal findings: Secondary | ICD-10-CM | POA: Diagnosis not present

## 2018-09-04 DIAGNOSIS — I10 Essential (primary) hypertension: Secondary | ICD-10-CM

## 2018-09-04 DIAGNOSIS — F17200 Nicotine dependence, unspecified, uncomplicated: Secondary | ICD-10-CM | POA: Diagnosis not present

## 2018-09-04 DIAGNOSIS — Z853 Personal history of malignant neoplasm of breast: Secondary | ICD-10-CM

## 2018-09-04 DIAGNOSIS — E7800 Pure hypercholesterolemia, unspecified: Secondary | ICD-10-CM | POA: Diagnosis not present

## 2018-09-04 MED ORDER — BENZONATATE 200 MG PO CAPS: 200.0000 mg | ORAL_CAPSULE | Freq: Two times a day (BID) | ORAL | 0 refills | Status: DC | PRN

## 2018-09-04 MED ORDER — HYDROCODONE-HOMATROPINE 5-1.5 MG/5ML PO SYRP: 5.0000 mL | ORAL_SOLUTION | Freq: Three times a day (TID) | ORAL | 0 refills | Status: DC | PRN

## 2018-09-04 MED ORDER — BUSPIRONE HCL 10 MG PO TABS: 10.0000 mg | ORAL_TABLET | Freq: Two times a day (BID) | ORAL | 1 refills | Status: DC

## 2018-09-04 MED ORDER — ALBUTEROL SULFATE HFA 108 (90 BASE) MCG/ACT IN AERS: 2.0000 | INHALATION_SPRAY | Freq: Four times a day (QID) | RESPIRATORY_TRACT | 0 refills | Status: DC | PRN

## 2018-09-04 NOTE — Telephone Encounter (Signed)
Received fax requesting alternative to Hycodan. Medication is on back order.   MD please advise.

## 2018-09-04 NOTE — Telephone Encounter (Signed)
I sent tessalon perles

## 2018-09-04 NOTE — Progress Notes (Signed)
Subjective:    Patient ID: Jessica Dean, female    DOB: July 27, 1960, 58 y.o.   MRN: [id number]  HPI Patient is a very pleasant 58 year old white female with a history of breast cancer status post bilateral mastectomy.  She sees an oncologist at [facility]. She is still having annual mammograms due to a trans-flap reconstructive surgery that left residual tissue. Last colonoscopy was in 2014 and was due again in 2019. Pap smear is performed annually at her gynecologist.  We discussed scheduling the colonoscopy today.  The patient states that she has severe reflux despite using Dexilant at night.  She would like to see her gastroenterologist and she request to schedule this herself so that she can work around her schedule.  She is also due for mammogram.  She also schedule this with her gynecologist.  She is already attempted to schedule this but they are backed up for 1 month however she states that she will schedule this as soon as they become available.  She had her flu shot where she works.  She works at [employer].  Her tetanus shot is up-to-date.  Because of her history of smoking and vaping I did recommend Pneumovax 23.  The patient does not feel well today and declines this at the present time but she can return at any point for this.  She has 2 issues today in addition to her physical.  #1, the patient has been experiencing anxiety recently.  In her past she use BuSpar for anxiety.  Previously her anxiety was related to marriage difficulties as well as stress with her kids.  Her symptoms improved over several years and she was able to come off medication.  However recently there is been increasing stress where she works.  There have also been both psychological and physical issues with her daughter Jarrett Soho and therefore her anxiety has increased.  Therefore she resumed her previous medicine BuSpar 10 mg once daily and she has seen significant benefit.  She would like me to prescribe  this for her which I am happy to do.  She is only been taking it once a day and still feels that the medication works well for her as far as controlling anxiety.  Her second issue is an upper respiratory infection.  This began approximately 3 days ago.  Symptoms include head congestion and chest congestion.  During the day she does well however at night, she feels a burning sensation in the center of her chest.  She will occasionally experiencing chest pain.  She reports cough and upper airway congestion and "rattling".  She also occasionally has wheezing.  She denies any purulent sputum.  She denies any sinus pain.  She denies any hemoptysis.  She has no shortness of breath today.  Pulmonary exam is clear aside from mild rhonchorous breath sounds. Past Medical History:  Diagnosis Date   Achalasia    Anemia    Breast cancer ([place]) 08/2006   left T1bN0Mx stage 1- s/p bilateral mastectomy TRAM flap reconstruction   Colon polyps    TUBULAR ADENOMAS (X2) AND HYPERPLASTIC POLYP(S   GERD (gastroesophageal reflux disease)    High risk medication use    Hyperlipidemia    Hypertension    no medication needed for 10-15 years   MVP (mitral valve prolapse)    Tinea pedis    Past Surgical History:  Procedure Laterality Date   APPENDECTOMY     BREAST SURGERY     COLONOSCOPY  07/08/2012   DILATATION & CURETTAGE/HYSTEROSCOPY WITH MYOSURE N/A 06/27/2016   Procedure: [place];  Surgeon: Princess Bruins, MD;  Location: [facility];  Service: Gynecology;  Laterality: N/A;   hellermyotomy     esophagus   MASTECTOMY  11/12/2006   bilateral, TRAM flap reconstruction   UPPER GASTROINTESTINAL ENDOSCOPY  07/08/2012   Current Outpatient Medications on File Prior to Visit  Medication Sig Dispense Refill   dexlansoprazole (DEXILANT) 60 MG capsule Take 1 capsule (60 mg total) by mouth daily. 30 capsule 3   lisinopril-hydrochlorothiazide (PRINZIDE,ZESTORETIC)  20-12.5 MG tablet Take 1 tablet by mouth daily. 90 tablet 3   omeprazole (PRILOSEC) 40 MG capsule TAKE 1 CAPSULE(40 MG) BY MOUTH DAILY 30 capsule 0   pravastatin (PRAVACHOL) 40 MG tablet Take 1 tablet (40 mg total) by mouth daily. (Patient not taking: Reported on 05/15/2018) 90 tablet 1   rosuvastatin (CRESTOR) 10 MG tablet Take 1 tablet (10 mg total) by mouth daily. 90 tablet 1   No current facility-administered medications on file prior to visit.    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: Not on file   Food insecurity:    Worry: Not on file    Inability: Not on file   Transportation needs:    Medical: Not on file    Non-medical: Not on file  Tobacco Use   Smoking status: Current Every Day Smoker    Packs/day: 1.00    Years: 35.00    Pack years: 35.00    Types: Cigarettes   Smokeless tobacco: Never Used  Substance and Sexual Activity   Alcohol use: Yes   Drug use: No   Sexual activity: Not on file  Lifestyle   Physical activity:    Days per week: Not on file    Minutes per session: Not on file   Stress: Not on file  Relationships   Social connections:    Talks on phone: Not on file    Gets together: Not on file    Attends religious service: Not on file    Active member of club or organization: Not on file    Attends meetings of clubs or organizations: Not on file    Relationship status: Not on file   Intimate partner violence:    Fear of current or ex partner: Not on file     Emotionally abused: Not on file    Physically abused: Not on file    Forced sexual activity: Not on file  Other Topics Concern   Not on file  Social History Narrative   Currently separated as of January 2014   2 daughters   Earnie Larsson as a Solicitor for [employer]   Family History  Problem Relation Age of Onset   Hypertension Mother    Breast cancer Mother 25       died age 55   Hypertension Paternal Grandfather    Hypertension Maternal Grandfather  Cancer Maternal Grandfather       Review of Systems  All other systems reviewed and are negative.      Objective:   Physical Exam  Constitutional: She is oriented to person, place, and time. She appears well-developed and well-nourished. No distress.  HENT:  Head: Normocephalic and atraumatic.  Right Ear: External ear normal.  Left Ear: External ear normal.  Nose: Nose normal.  Mouth/Throat: Oropharynx is clear and moist. No oropharyngeal exudate.  Eyes: Pupils are equal, round, and reactive to light. Conjunctivae and EOM are normal. Right eye exhibits no discharge. Left eye exhibits no discharge. No scleral icterus.  Neck: Normal range of motion. Neck supple. No JVD present. No tracheal deviation present. No thyromegaly present.  Cardiovascular: Normal rate, regular rhythm, normal heart sounds and intact distal pulses. Exam reveals no gallop and no friction rub.  No murmur heard. Pulmonary/Chest: Effort normal. No stridor. No respiratory distress. She has no wheezes. She has rhonchi. She has no rales. She exhibits no tenderness.  Abdominal: Soft. Bowel sounds are normal. She exhibits no distension and no mass. There is no abdominal tenderness. There is no rebound and no guarding.  Musculoskeletal: Normal range of motion.        General: No tenderness, deformity or edema.  Lymphadenopathy:    She has no cervical adenopathy.  Neurological: She is alert and oriented to person, place, and time. She has  normal reflexes. No cranial nerve deficit. She exhibits normal muscle tone. Coordination normal.  Skin: Skin is warm. No rash noted. She is not diaphoretic. No erythema. No pallor.  Psychiatric: She has a normal mood and affect. Her behavior is normal. Judgment and thought content normal.  Vitals reviewed.         Assessment & Plan:  General medical exam  Pure hypercholesterolemia - Plan: CBC with Differential/Platelet, COMPLETE METABOLIC PANEL WITH GFR, Lipid panel  Benign essential HTN  Smoker  History of breast cancer  Patient will schedule her colonoscopy.  She also elects to schedule her mammogram.  Pap smear was performed at her gynecologist.  Flu shot and tetanus shot are up-to-date.  I recommended Pneumovax 23 when she feels better.  Her blood pressure today is well controlled.  I will check a CBC, CMP, fasting lipid panel.  She is no longer smoking but she is still vaping.  I recommended that she discontinue that.  I believe she has a viral upper respiratory infection that is likely causing bronchospasms with wheezing and chest congestion.  I recommended using Coricidin HBP to help with head congestion and chest congestion.  She can use albuterol 2 puffs inhaled every 6 hours as needed for wheezing and shortness of breath particularly at night.  She can also use Hycodan 1 teaspoon every 8 hours as needed for cough.  We will resume BuSpar 10 mg twice daily for anxiety.

## 2018-09-05 LAB — CBC WITH DIFFERENTIAL/PLATELET
ABSOLUTE MONOCYTES: 706 {cells}/uL (ref 200–950)
BASOS PCT: 0.9 %
Basophils Absolute: 57 cells/uL (ref 0–200)
Eosinophils Absolute: 296 cells/uL (ref 15–500)
Eosinophils Relative: 4.7 %
HCT: 38.7 % (ref 35.0–45.0)
Hemoglobin: 13.3 g/dL (ref 11.7–15.5)
LYMPHS ABS: 1386 {cells}/uL (ref 850–3900)
MCH: 29.7 pg (ref 27.0–33.0)
MCHC: 34.4 g/dL (ref 32.0–36.0)
MCV: 86.4 fL (ref 80.0–100.0)
MPV: 10.4 fL (ref 7.5–12.5)
Monocytes Relative: 11.2 %
NEUTROS PCT: 61.2 %
Neutro Abs: 3856 cells/uL (ref 1500–7800)
PLATELETS: 318 10*3/uL (ref 140–400)
RBC: 4.48 10*6/uL (ref 3.80–5.10)
RDW: 13.2 % (ref 11.0–15.0)
TOTAL LYMPHOCYTE: 22 %
WBC: 6.3 10*3/uL (ref 3.8–10.8)

## 2018-09-05 LAB — COMPLETE METABOLIC PANEL WITH GFR
AG Ratio: 1.6 (calc) (ref 1.0–2.5)
ALKALINE PHOSPHATASE (APISO): 64 U/L (ref 37–153)
ALT: 40 U/L — AB (ref 6–29)
AST: 38 U/L — AB (ref 10–35)
Albumin: 4.5 g/dL (ref 3.6–5.1)
BUN: 9 mg/dL (ref 7–25)
CALCIUM: 9.9 mg/dL (ref 8.6–10.4)
CO2: 27 mmol/L (ref 20–32)
CREATININE: 0.75 mg/dL (ref 0.50–1.05)
Chloride: 100 mmol/L (ref 98–110)
GFR, EST NON AFRICAN AMERICAN: 89 mL/min/{1.73_m2} (ref >=60)
GFR, Est African American: 103 mL/min/{1.73_m2} (ref >=60)
GLUCOSE: 100 mg/dL — AB (ref 65–99)
Globulin: 2.9 g/dL (calc) (ref 1.9–3.7)
Potassium: 5 mmol/L (ref 3.5–5.3)
Sodium: 138 mmol/L (ref 135–146)
Total Bilirubin: 0.7 mg/dL (ref 0.2–1.2)
Total Protein: 7.4 g/dL (ref 6.1–8.1)

## 2018-09-05 LAB — LIPID PANEL
CHOL/HDL RATIO: 2 (calc) (ref <5.0)
Cholesterol: 174 mg/dL (ref <200)
HDL: 86 mg/dL (ref >=50)
LDL Cholesterol (Calc): 68 mg/dL (calc)
NON-HDL CHOLESTEROL (CALC): 88 mg/dL (ref <130)
TRIGLYCERIDES: 113 mg/dL (ref <150)

## 2018-09-07 ENCOUNTER — Other Ambulatory Visit: Payer: Self-pay | Admitting: Family Medicine

## 2018-09-07 DIAGNOSIS — R945 Abnormal results of liver function studies: Principal | ICD-10-CM

## 2018-09-07 DIAGNOSIS — R7989 Other specified abnormal findings of blood chemistry: Secondary | ICD-10-CM

## 2018-09-19 ENCOUNTER — Other Ambulatory Visit: Payer: Self-pay | Admitting: Family Medicine

## 2018-11-19 ENCOUNTER — Other Ambulatory Visit: Payer: Self-pay | Admitting: Family Medicine

## 2018-11-30 ENCOUNTER — Telehealth: Payer: BC Managed Care – PPO | Admitting: Physician Assistant

## 2018-11-30 ENCOUNTER — Encounter: Payer: Self-pay | Admitting: Physician Assistant

## 2018-11-30 NOTE — Progress Notes (Signed)
As per our conversation, you have asked that I cancel this Evisit and that you will follow up with your doctor for further workup.     NOTE: If you entered your credit card information for this eVisit, you will not be charged. You may see a "hold" on your card for the $35 but that hold will drop off and you will not have a charge processed.  If you are having a true medical emergency please call 911.  If you need an urgent face to face visit, [facility] has four urgent care centers for your convenience.      [email or web address] to reserve your spot online an avoid wait times  [facility] [address] [place], [place] [address] Modified hours of operation: Monday-Friday, 12 PM to 6 PM  Saturday & Sunday 10 AM to 4 PM *Across the street from [facility] [place] (New Address!) [address] [place], [place] [address] *Just off [address], across the road from Long hours of operation: Monday-Friday, 12 PM to 6 PM  Closed Saturday & Sunday   The following sites will take your insurance:  . [facility] [place] a Provider at this Location  [address] [place], [place] [address] . 10 am to 8 pm Monday-Friday . 12 pm to 8 pm Saturday-Sunday   . [facility] at [place] a Provider at this Location  [address] [place], [place], [place] [address] . 8 am to 8 pm Monday-Friday . 9 am to 6 pm Saturday . 11 am to 6 pm Sunday   . [facility] at [place] Get Driving Directions  [address] Suite [place], [place] [address] . 8 am to 8 pm Monday-Friday . 8 am to 4 pm Saturday-Sunday   Your e-visit answers were reviewed by a board certified advanced clinical practitioner to complete your personal care plan.  Thank you for  using e-Visits. I spent 5-10 minutes on review and completion of this note- Lacy Duverney [facility]

## 2019-01-14 ENCOUNTER — Other Ambulatory Visit: Payer: Self-pay | Admitting: Family Medicine

## 2019-02-03 ENCOUNTER — Other Ambulatory Visit: Payer: Self-pay

## 2019-02-03 ENCOUNTER — Ambulatory Visit: Payer: BC Managed Care – PPO | Admitting: Family Medicine

## 2019-02-03 ENCOUNTER — Encounter: Payer: Self-pay | Admitting: Family Medicine

## 2019-02-03 VITALS — BP 136/80 | HR 88 | Temp 98.1°F | Resp 16 | Ht 66.0 in | Wt 194.0 lb

## 2019-02-03 DIAGNOSIS — B001 Herpesviral vesicular dermatitis: Secondary | ICD-10-CM

## 2019-02-03 DIAGNOSIS — M5432 Sciatica, left side: Secondary | ICD-10-CM | POA: Diagnosis not present

## 2019-02-03 MED ORDER — VALACYCLOVIR HCL 1 G PO TABS: 2000.0000 mg | ORAL_TABLET | Freq: Two times a day (BID) | ORAL | 5 refills | Status: DC

## 2019-02-03 MED ORDER — PREDNISONE 20 MG PO TABS: ORAL_TABLET | ORAL | 0 refills | Status: DC

## 2019-02-03 NOTE — Progress Notes (Signed)
Subjective:    Patient ID: Jessica Dean, female    DOB: [birth year], 58 y.o.   MRN: [id number] Patient reports a cold sore that frequently reoccurs on her center of her lower lip.  Is approximately a 3 mm yellow vesicle with erythematous base that burns and hurts.  She also reports pain in her left hip ever since a trip to the beach.  The pain radiates down the posterior lateral aspect of her left hip below her left knee all the way into her foot.  She also reports pins-and-needles radiating down her leg into her left foot.  She does complain of low back pain.  The pain worsens with activities such as playing tennis.  At rest the pain is minimal.  She has not tried any anti-inflammatories.  She denies any bowel or bladder incontinence.  She denies any saddle anesthesia.  She does occasionally have low back pain.  She denies any fevers or chills.  She has full and painless range of motion in her hip.  Past Medical History:  Diagnosis Date  . Achalasia   . Anemia   . Breast cancer ([place]) 08/2006   left T1bN0Mx stage 1- s/p bilateral mastectomy TRAM flap reconstruction  . Colon polyps    TUBULAR ADENOMAS (X2) AND HYPERPLASTIC POLYP(S  . GERD (gastroesophageal reflux disease)   . High risk medication use   . Hyperlipidemia   . Hypertension    no medication needed for 10-15 years  . MVP (mitral valve prolapse)   . Tinea pedis    Past Surgical History:  Procedure Laterality Date  . APPENDECTOMY    . BREAST SURGERY    . COLONOSCOPY  07/08/2012  . DILATATION & CURETTAGE/HYSTEROSCOPY WITH MYOSURE N/A 06/27/2016   Procedure: DILATATION & CURETTAGE/HYSTEROSCOPY WITH MYOSURE;  Surgeon: Princess Bruins, MD;  Location: [facility];  Service: Gynecology;  Laterality: N/A;  . hellermyotomy     esophagus  . MASTECTOMY  11/12/2006   bilateral, TRAM flap reconstruction  . UPPER GASTROINTESTINAL ENDOSCOPY  07/08/2012   Current Outpatient Medications on File Prior to Visit  Medication Sig Dispense Refill   . albuterol (PROVENTIL HFA;VENTOLIN HFA) 108 (90 Base) MCG/ACT inhaler Inhale 2 puffs into the lungs every 6 (six) hours as needed for wheezing or shortness of breath. 1 Inhaler 0  . benzonatate (TESSALON) 200 MG capsule Take 1 capsule (200 mg total) by mouth 2 (two) times daily as needed for cough. 20 capsule 0  . busPIRone (BUSPAR) 10 MG tablet Take 1 tablet (10 mg total) by mouth 2 (two) times daily. 180 tablet 1  . DEXILANT 60 MG capsule TAKE 1 CAPSULE(60 MG) BY MOUTH DAILY 90 capsule 3  . HYDROcodone-homatropine (HYCODAN) 5-1.5 MG/5ML syrup Take 5 mLs by mouth every 8 (eight) hours as needed for cough. 120 mL 0  . lisinopril-hydrochlorothiazide (PRINZIDE,ZESTORETIC) 20-12.5 MG tablet Take 1 tablet by mouth daily. 90 tablet 3  . rosuvastatin (CRESTOR) 10 MG tablet TAKE 1 TABLET(10 MG) BY MOUTH DAILY 90 tablet 2   No current facility-administered medications on file prior to visit.    Allergies  Allergen Reactions  . Iohexol Hives     Code: HIVES, Desc: developed hives after injection   . Neosporin [Neomycin-Bacitracin Zn-Polymyx] Rash  . Tape Rash   Social History   Socioeconomic History  . Marital status: Legally Separated    Spouse name: Not on file  . Number of children: 2  . Years of education: Not on file  . Highest education  level: Not on file  Occupational History  . 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 Financial resource strain: Not on file  . Food insecurity    Worry: Not on file    Inability: Not on file  . Transportation needs    Medical: Not on file    Non-medical: Not on file  Tobacco Use  . Smoking status: Former Smoker    Packs/day: 1.00    Years: 35.00    Pack years: 35.00    Types: Cigarettes  . Smokeless tobacco: Never Used  . Tobacco comment: Pt is vaping  Substance and Sexual Activity  . Alcohol use: Yes  . Drug use: No  . Sexual activity: Not on file  Lifestyle  . Physical activity     Days per week: Not on file    Minutes per session: Not on file  . Stress: Not on file  Relationships  . Social Herbalist on phone: Not on file    Gets together: Not on file    Attends religious service: Not on file    Active member of club or organization: Not on file    Attends meetings of clubs or organizations: Not on file    Relationship status: Not on file  . Intimate partner violence    Fear of current or ex partner: Not on file    Emotionally abused: Not on file    Physically abused: Not on file    Forced sexual activity: Not on file  Other Topics Concern  . Not on file  Social History Narrative   Currently separated as of January 2014   2 daughters   Earnie Larsson as a Solicitor for [employer]   Family History  Problem Relation Age of Onset  . Hypertension Mother   . Breast cancer Mother 18       died age 65  . Hypertension Paternal Grandfather   . Hypertension Maternal Grandfather   . Cancer Maternal Grandfather       Review of Systems  All other systems reviewed and are negative.      Objective:   Physical Exam Vitals signs reviewed.  Constitutional:      General: She is not in acute distress.    Appearance: She is well-developed. She is not diaphoretic.  HENT:     Mouth/Throat:   Eyes:     General: No scleral icterus. Neck:     Musculoskeletal: Normal range of motion and neck supple.     Thyroid: No thyromegaly.     Vascular: No JVD.     Trachea: No tracheal deviation.  Cardiovascular:     Rate and Rhythm: Normal rate and regular rhythm.     Heart sounds: Normal heart sounds. No murmur. No friction rub. No gallop.   Pulmonary:     Effort: Pulmonary effort is normal. No respiratory distress.     Breath sounds: Normal breath sounds. No stridor. No wheezing or rales.  Chest:     Chest wall: No tenderness.  Musculoskeletal:       Legs:  Lymphadenopathy:     Cervical: No cervical adenopathy.  Neurological:      Mental Status: She is alert.     Motor: No abnormal muscle tone.     Deep Tendon Reflexes: Reflexes are normal and symmetric.           Assessment & Plan:  1.  Left sided sciatica Begin ibuprofen 800 mg every 8 hours and proceed with an x-ray of the lumbar spine.  If no better in 1 week add prednisone taper pack. - DG Lumbar Spine Complete; Future  2. Cold sore Use Valtrex 2 g p.o. twice daily for 1 day

## 2019-02-09 ENCOUNTER — Encounter: Payer: Self-pay | Admitting: Family Medicine

## 2019-02-09 ENCOUNTER — Other Ambulatory Visit: Payer: Self-pay | Admitting: Family Medicine

## 2019-02-09 MED ORDER — CYCLOBENZAPRINE HCL 10 MG PO TABS: 10.0000 mg | ORAL_TABLET | Freq: Three times a day (TID) | ORAL | 0 refills | Status: DC | PRN

## 2019-02-10 ENCOUNTER — Ambulatory Visit
Admission: RE | Admit: 2019-02-10 | Discharge: 2019-02-10 | Disposition: A | Discharge status: Home / Self Care | Payer: BC Managed Care – PPO | Source: Ambulatory Visit | Attending: Family Medicine | Admitting: Family Medicine

## 2019-02-10 DIAGNOSIS — M5432 Sciatica, left side: Secondary | ICD-10-CM

## 2019-05-14 ENCOUNTER — Encounter: Payer: Self-pay | Admitting: Family Medicine

## 2019-06-08 ENCOUNTER — Other Ambulatory Visit: Payer: Self-pay | Admitting: Family Medicine

## 2019-06-09 ENCOUNTER — Other Ambulatory Visit: Payer: Self-pay

## 2019-06-09 ENCOUNTER — Ambulatory Visit: Payer: BC Managed Care – PPO | Attending: Internal Medicine

## 2019-06-09 DIAGNOSIS — Z20822 Contact with and (suspected) exposure to covid-19: Secondary | ICD-10-CM

## 2019-06-11 LAB — NOVEL CORONAVIRUS, NAA: SARS-CoV-2, NAA: NOT DETECTED

## 2019-09-08 ENCOUNTER — Other Ambulatory Visit: Payer: Self-pay | Admitting: Family Medicine

## 2019-09-08 NOTE — Telephone Encounter (Signed)
Requesting refill    Flexeril  LOV: 02/03/19  LRF:  02/09/2019

## 2019-10-18 ENCOUNTER — Other Ambulatory Visit: Payer: Self-pay

## 2019-10-19 ENCOUNTER — Other Ambulatory Visit: Payer: Self-pay

## 2019-10-19 DIAGNOSIS — Z853 Personal history of malignant neoplasm of breast: Secondary | ICD-10-CM

## 2019-12-16 ENCOUNTER — Other Ambulatory Visit: Payer: BC Managed Care – PPO

## 2019-12-28 ENCOUNTER — Ambulatory Visit
Admission: RE | Admit: 2019-12-28 | Discharge: 2019-12-28 | Disposition: A | Discharge status: Home / Self Care | Payer: BC Managed Care – PPO | Source: Ambulatory Visit

## 2019-12-28 ENCOUNTER — Other Ambulatory Visit: Payer: Self-pay

## 2019-12-28 DIAGNOSIS — Z853 Personal history of malignant neoplasm of breast: Secondary | ICD-10-CM

## 2020-04-23 ENCOUNTER — Other Ambulatory Visit: Payer: Self-pay | Admitting: Family Medicine

## 2020-04-24 NOTE — Telephone Encounter (Signed)
Ok to refill 

## 2020-04-27 ENCOUNTER — Other Ambulatory Visit: Payer: Self-pay

## 2020-04-27 ENCOUNTER — Ambulatory Visit
Admission: RE | Admit: 2020-04-27 | Discharge: 2020-04-27 | Disposition: A | Discharge status: Home / Self Care | Payer: BC Managed Care – PPO | Source: Ambulatory Visit | Attending: Family Medicine | Admitting: Family Medicine

## 2020-04-27 ENCOUNTER — Ambulatory Visit: Payer: BC Managed Care – PPO | Admitting: Family Medicine

## 2020-04-27 VITALS — BP 128/70 | HR 89 | Temp 97.6°F | Ht 66.0 in | Wt 198.0 lb

## 2020-04-27 DIAGNOSIS — M5412 Radiculopathy, cervical region: Secondary | ICD-10-CM

## 2020-04-27 MED ORDER — PREDNISONE 20 MG PO TABS: ORAL_TABLET | ORAL | 0 refills | Status: DC

## 2020-04-27 NOTE — Progress Notes (Signed)
Subjective:    Patient ID: Jessica Dean, female    DOB: 31-Dec-1960, 59 y.o.   MRN: [id number] Patient is a very pleasant 59 year old Caucasian female who presents today complaining of right-sided neck pain and right arm pain.  She states that the symptoms began about 2 weeks ago.  She denies any injuries or falls or inciting event.  However she started developing pain in her right neck that radiated into her right shoulder bone.  Shortly thereafter she developed pain and burning and stinging that would radiate all the way down her right arm into her right wrist.  The pain begins in the right side of the neck and radiates to the wrist and also into the shoulder blade.  Pain is made worse by looking up.  Extension of the neck exacerbates the pain.  She has a positive Spurling sign. Past Medical History:  Diagnosis Date  . Achalasia   . Anemia   . Breast cancer ([place]) 08/2006   left T1bN0Mx stage 1- s/p bilateral mastectomy TRAM flap reconstruction  . Colon polyps    TUBULAR ADENOMAS (X2) AND HYPERPLASTIC POLYP(S  . GERD (gastroesophageal reflux disease)   . High risk medication use   . Hyperlipidemia   . Hypertension    no medication needed for 10-15 years  . MVP (mitral valve prolapse)   . Tinea pedis    Past Surgical History:  Procedure Laterality Date  . APPENDECTOMY    . BREAST SURGERY    . COLONOSCOPY  07/08/2012  . DILATATION & CURETTAGE/HYSTEROSCOPY WITH MYOSURE N/A 06/27/2016   Procedure: DILATATION & CURETTAGE/HYSTEROSCOPY WITH MYOSURE;  Surgeon: Princess Bruins, MD;  Location: [facility];  Service: Gynecology;  Laterality: N/A;  . hellermyotomy     esophagus  . MASTECTOMY  11/12/2006   bilateral, TRAM flap reconstruction  . UPPER GASTROINTESTINAL ENDOSCOPY  07/08/2012   Current Outpatient Medications on File Prior to Visit  Medication Sig Dispense Refill  . busPIRone (BUSPAR) 10 MG tablet TAKE 1 TABLET(10 MG) BY MOUTH TWICE DAILY 180 tablet 1  . cyclobenzaprine (FLEXERIL) 10  MG tablet TAKE 1 TABLET BY MOUTH THREE TIMES DAILY AS NEEDED FOR MUSCLE SPASMS 30 tablet 0  . DEXILANT 60 MG capsule TAKE 1 CAPSULE(60 MG) BY MOUTH DAILY 90 capsule 3  . lisinopril-hydrochlorothiazide (ZESTORETIC) 20-12.5 MG tablet TAKE 1 TABLET BY MOUTH EVERY DAY 90 tablet 3  . tiZANidine (ZANAFLEX) 2 MG tablet Take 1 tablet by mouth.    . valACYclovir (VALTREX) 1000 MG tablet Take 2 tablets (2,000 mg total) by mouth 2 (two) times daily. 4 tablet 5  . rosuvastatin (CRESTOR) 10 MG tablet TAKE 1 TABLET(10 MG) BY MOUTH DAILY (Patient not taking: Reported on 02/03/2019) 90 tablet 2   No current facility-administered medications on file prior to visit.   Allergies  Allergen Reactions  . Iohexol Hives     Code: HIVES, Desc: developed hives after injection   . Neosporin [Neomycin-Bacitracin Zn-Polymyx] Rash  . Tape Rash   Social History   Socioeconomic History  . Marital status: Legally Separated    Spouse name: Not on file  . Number of children: 2  . Years of education: Not on file  . Highest education level: Not on file  Occupational History  . 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 Smoking status: Former Smoker    Packs/day: 1.00    Years: 35.00    Pack years: 35.00  Types: Cigarettes  . Smokeless tobacco: Never Used  . Tobacco comment: Pt is vaping  Substance and Sexual Activity  . Alcohol use: Yes  . Drug use: No  . Sexual activity: Not on file  Other Topics Concern  . Not on file  Social History Narrative   Currently separated as of January 2014   2 daughters   Earnie Larsson as a Solicitor for [employer]   Social Determinants of Health   Financial Resource Strain:   . Difficulty of Paying Living Expenses: Not on file  Food Insecurity:   . Worried About Charity fundraiser in the Last Year: Not on file  . Ran Out of Food in the Last Year: Not on file  Transportation Needs:   . Lack of  Transportation (Medical): Not on file  . Lack of Transportation (Non-Medical): Not on file  Physical Activity:   . Days of Exercise per Week: Not on file  . Minutes of Exercise per Session: Not on file  Stress:   . Feeling of Stress : Not on file  Social Connections:   . Frequency of Communication with Friends and Family: Not on file  . Frequency of Social Gatherings with Friends and Family: Not on file  . Attends Religious Services: Not on file  . Active Member of Clubs or Organizations: Not on file  . Attends Archivist Meetings: Not on file  . Marital Status: Not on file  Intimate Partner Violence:   . Fear of Current or Ex-Partner: Not on file  . Emotionally Abused: Not on file  . Physically Abused: Not on file  . Sexually Abused: Not on file   Family History  Problem Relation Age of Onset  . Hypertension Mother   . Breast cancer Mother 73       died age 41  . Hypertension Paternal Grandfather   . Hypertension Maternal Grandfather   . Cancer Maternal Grandfather       Review of Systems  All other systems reviewed and are negative.      Objective:   Physical Exam Vitals reviewed.  Constitutional:      General: She is not in acute distress.    Appearance: She is well-developed. She is not diaphoretic.  Neck:     Thyroid: No thyromegaly.     Vascular: No JVD.     Trachea: No tracheal deviation.  Cardiovascular:     Rate and Rhythm: Normal rate and regular rhythm.     Heart sounds: Normal heart sounds. No murmur heard.  No friction rub. No gallop.   Pulmonary:     Effort: Pulmonary effort is normal. No respiratory distress.     Breath sounds: Normal breath sounds. No stridor. No wheezing or rales.  Chest:     Chest wall: No tenderness.  Musculoskeletal:     Cervical back: Neck supple. Spasms present. No tenderness or bony tenderness. Pain with movement present. Decreased range of motion.       Back:  Lymphadenopathy:     Cervical: No cervical  adenopathy.  Neurological:     Mental Status: She is alert.     Motor: No abnormal muscle tone.     Deep Tendon Reflexes: Reflexes are normal and symmetric.           Assessment & Plan:  Cervical radiculopathy - Plan: predniSONE (DELTASONE) 20 MG tablet, DG Cervical Spine Complete  Patient has been taking Zanaflex 2 mg every 6 hours as prescribed  by her oncologist.  She can take up to 4 mg every 6 hours.  In addition add a prednisone taper pack for what I suspect could be a herniated disc.  Obtain an x-ray of the cervical spine to evaluate further.  There is no evidence of shingles on examination of her skin today.

## 2020-05-11 ENCOUNTER — Encounter: Payer: Self-pay | Admitting: Family Medicine

## 2020-05-11 MED ORDER — TIZANIDINE HCL 2 MG PO TABS: 2.0000 mg | ORAL_TABLET | Freq: Three times a day (TID) | ORAL | 0 refills | Status: DC | PRN

## 2020-06-06 ENCOUNTER — Other Ambulatory Visit: Payer: Self-pay | Admitting: Family Medicine

## 2020-09-16 ENCOUNTER — Other Ambulatory Visit: Payer: Self-pay | Admitting: Family Medicine

## 2020-11-16 ENCOUNTER — Ambulatory Visit (INDEPENDENT_AMBULATORY_CARE_PROVIDER_SITE_OTHER): Payer: BC Managed Care – PPO | Admitting: Nurse Practitioner

## 2020-11-16 DIAGNOSIS — B029 Zoster without complications: Secondary | ICD-10-CM

## 2020-11-16 MED ORDER — VALACYCLOVIR HCL 1 G PO TABS: 1000.0000 mg | ORAL_TABLET | Freq: Three times a day (TID) | ORAL | 0 refills | Status: AC

## 2020-11-16 NOTE — Progress Notes (Signed)
Subjective:    Patient ID: Jessica Dean, female    DOB: 1960-11-02, 60 y.o.   MRN: [id number]  HPI: Jessica Dean is a 60 y.o. female presenting for rash she thinks may be shingles.  Chief Complaint  Patient presents with  . Rash    Burning and itching on the back for 2 days, believes she has shingles   SHINGLES Patient reports burning sensation in her mid back started May 17.  Has not changed much.  She reports within the past 48 hours, a small blister has popped up near the top of her back.  The blister is intact. Duration: started May 17 Location:   Bra strap height on back near spine Painful:  no Severity: moderate  Description: burning  Paresthesia:  no Hyperesthesia: no Itching:  yes Burning:  yes Oozing:  no Blisters:  yes Fevers:  no History of the same:  no Alleviating factors: nothing tried, body lotion Status: same Treatments attempted: body lotion  Allergies  Allergen Reactions  . Iohexol Hives     Code: HIVES, Desc: developed hives after injection   . Neosporin [Neomycin-Bacitracin Zn-Polymyx] Rash  . Tape Rash    Outpatient Encounter Medications as of 11/16/2020  Medication Sig  . busPIRone (BUSPAR) 10 MG tablet TAKE 1 TABLET(10 MG) BY MOUTH TWICE DAILY  . DEXILANT 60 MG capsule TAKE 1 CAPSULE(60 MG) BY MOUTH DAILY  . lisinopril-hydrochlorothiazide (ZESTORETIC) 20-12.5 MG tablet TAKE 1 TABLET BY MOUTH EVERY DAY  . valACYclovir (VALTREX) 1000 MG tablet Take 1 tablet (1,000 mg total) by mouth 3 (three) times daily for 7 days.  . [DISCONTINUED] cyclobenzaprine (FLEXERIL) 10 MG tablet TAKE 1 TABLET BY MOUTH THREE TIMES DAILY AS NEEDED FOR MUSCLE SPASMS  . [DISCONTINUED] predniSONE (DELTASONE) 20 MG tablet 3 tabs poqday 1-2, 2 tabs poqday 3-4, 1 tab poqday 5-6  . [DISCONTINUED] rosuvastatin (CRESTOR) 10 MG tablet TAKE 1 TABLET(10 MG) BY MOUTH DAILY (Patient not taking: Reported on 02/03/2019)  . [DISCONTINUED] tiZANidine (ZANAFLEX) 2 MG tablet Take 1  tablet (2 mg total) by mouth every 8 (eight) hours as needed for muscle spasms.  . [DISCONTINUED] valACYclovir (VALTREX) 1000 MG tablet Take 2 tablets (2,000 mg total) by mouth 2 (two) times daily.   No facility-administered encounter medications on file as of 11/16/2020.    Patient Active Problem List   Diagnosis Date Noted  . Achalasia-status post Heller myotomy 12/31/2013  . Esophageal reflux 12/31/2013  . Breast cancer ([place]) 08/23/2006    Past Medical History:  Diagnosis Date  . Achalasia   . Anemia   . Breast cancer ([place]) 08/2006   left T1bN0Mx stage 1- s/p bilateral mastectomy TRAM flap reconstruction  . Colon polyps    TUBULAR ADENOMAS (X2) AND HYPERPLASTIC POLYP(S  . GERD (gastroesophageal reflux disease)   . High risk medication use   . Hyperlipidemia   . Hypertension    no medication needed for 10-15 years  . MVP (mitral valve prolapse)   . Tinea pedis     Relevant past medical, surgical, family and social history reviewed and updated as indicated. Interim medical history since our last visit reviewed.  Review of Systems Per HPI unless specifically indicated above     Objective:    LMP 06/02/2012   Wt Readings from Last 3 Encounters:  04/27/20 198 lb (89.8 kg)  02/03/19 194 lb (88 kg)  09/04/18 192 lb (87.1 kg)    Physical Exam Nursing note reviewed.  Constitutional:  General: She is not in acute distress.    Appearance: She is not toxic-appearing.  HENT:     Head: Normocephalic and atraumatic.  Musculoskeletal:     Cervical back: Normal range of motion. No rigidity.  Skin:    Coloration: Skin is not jaundiced or pale.     Findings: Rash present. No erythema.     Comments: Small vesicle noted to left upper back similar high as top of scapula.  Unable to palpate due to virtual visit, however appears intact and no surrounding erythema.  Neurological:     Mental Status: She is alert and oriented to person, place, and time.  Psychiatric:        Mood  and Affect: Mood normal.        Behavior: Behavior normal.        Thought Content: Thought content normal.        Judgment: Judgment normal.       Assessment & Plan:  1. Herpes zoster without complication Acute.  Rash consistent with shingles.  Patient is not immunocompromise-discussed treatment with antiviral although onset of symptoms was greater than 5 days ago, we can still try to help minimize symptoms.  Encouraged to leave vesicle intact and not open.  Can use topical NSAID for burning pain if this helps.  Follow-up if symptoms worsen.  - valACYclovir (VALTREX) 1000 MG tablet; Take 1 tablet (1,000 mg total) by mouth 3 (three) times daily for 7 days.  Dispense: 21 tablet; Refill: 0    Follow up plan: No follow-ups on file.  Due to the catastrophic nature of the COVID-19 pandemic, this video visit was completed soley via audio and visual contact via [facility] due to the restrictions of the COVID-19 pandemic.  All issues as above were discussed and addressed. Physical exam was done as above through visual confirmation on Caregility. If it was felt that the patient should be evaluated in the office, they were directed there. The patient verbally consented to this visit. . Location of the patient: home . Location of the provider: work . Those involved with this call:  . Provider: Noemi Chapel, DNP, FNP-C . CMA: Annabelle Harman, CMA . Front Desk/Registration: Santina Evans  . Time spent on call: 8 minutes with patient face to face via video conference. More than 50% of this time was spent in counseling and coordination of care. 14 minutes total spent in review of patient's record and preparation of their chart.  I verified patient identity using two factors (patient name and date of birth). Patient consents verbally to being seen via telemedicine visit today.

## 2020-11-17 ENCOUNTER — Encounter: Payer: Self-pay | Admitting: Nurse Practitioner

## 2020-11-17 NOTE — Patient Instructions (Signed)
Shingles  Shingles is an infection. It gives you a painful skin rash and blisters that have fluid in them. Shingles is caused by the same germ (virus) that causes chickenpox. Shingles only happens in people who:  Have had chickenpox.  Have been given a shot of medicine (vaccine) to protect against chickenpox. Shingles is rare in this group. The first symptoms of shingles may be itching, tingling, or pain in an area on your skin. A rash will show on your skin a few days or weeks later. The rash is likely to be on one side of your body. The rash usually has a shape like a belt or a band. Over time, the rash turns into fluid-filled blisters. The blisters will break open, change into scabs, and dry up. Medicines may:  Help with pain and itching.  Help you get better sooner.  Help to prevent long-term problems. Follow these instructions at home: Medicines  Take over-the-counter and prescription medicines only as told by your doctor.  Put on an anti-itch cream or numbing cream where you have a rash, blisters, or scabs. Do this as told by your doctor. Helping with itching and discomfort  Put cold, wet cloths (cold compresses) on the area of the rash or blisters as told by your doctor.  Cool baths can help you feel better. Try adding baking soda or dry oatmeal to the water to lessen itching. Do not bathe in hot water.   Blister and rash care  Keep your rash covered with a loose bandage (dressing).  Wear loose clothing that does not rub on your rash.  Keep your rash and blisters clean. To do this, wash the area with mild soap and cool water as told by your doctor.  Check your rash every day for signs of infection. Check for: ? More redness, swelling, or pain. ? Fluid or blood. ? Warmth. ? Pus or a bad smell.  Do not scratch your rash. Do not pick at your blisters. To help you to not scratch: ? Keep your fingernails clean and cut short. ? Wear gloves or mittens when you sleep, if  scratching is a problem. General instructions  Rest as told by your doctor.  Keep all follow-up visits as told by your doctor. This is important.  Wash your hands often with soap and water. If soap and water are not available, use hand sanitizer. Doing this lowers your chance of getting a skin infection caused by germs (bacteria).  Your infection can cause chickenpox in people who have never had chickenpox or never got a shot of chickenpox vaccine. If you have blisters that did not change into scabs yet, try not to touch other people or be around other people, especially: ? Babies. ? Pregnant women. ? Children who have areas of red, itchy, or rough skin (eczema). ? Very old people who have transplants. ? People who have a long-term (chronic) sickness, like cancer or AIDS. Contact a doctor if:  Your pain does not get better with medicine.  Your pain does not get better after the rash heals.  You have any signs of infection in the rash area. These signs include: ? More redness, swelling, or pain around the rash. ? Fluid or blood coming from the rash. ? The rash area feeling warm to the touch. ? Pus or a bad smell coming from the rash. Get help right away if:  The rash is on your face or nose.  You have pain in your face or pain  by your eye.  You lose feeling on one side of your face.  You have trouble seeing.  You have ear pain, or you have ringing in your ear.  You have a loss of taste.  Your condition gets worse. Summary  Shingles gives you a painful skin rash and blisters that have fluid in them.  Shingles is an infection. It is caused by the same germ (virus) that causes chickenpox.  Keep your rash covered with a loose bandage (dressing). Wear loose clothing that does not rub on your rash.  If you have blisters that did not change into scabs yet, try not to touch other people or be around people. This information is not intended to replace advice given to you by  your health care provider. Make sure you discuss any questions you have with your health care provider. Document Revised: 10/02/2018 Document Reviewed: 02/12/2017 Elsevier Patient Education  2021 [employer].

## 2020-12-11 IMAGING — DX DG CERVICAL SPINE COMPLETE 4+V
5 series · 5 of 5 positions shown · non-contrast
Comparison: None.

CLINICAL DATA: Right-sided cervical radiculopathy for 1 month.

EXAM:
CERVICAL SPINE - COMPLETE 4+ VIEW

[dg cervical spine complete (1 of 5)]
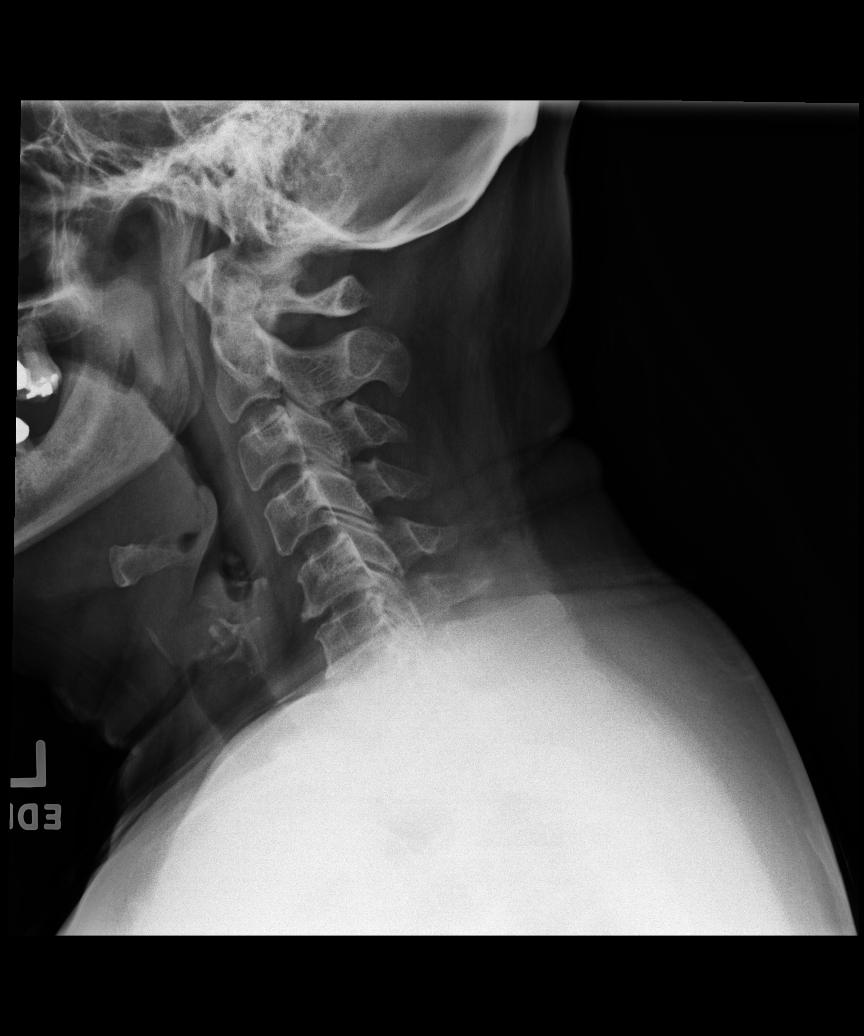

[dg cervical spine complete (2 of 5)]
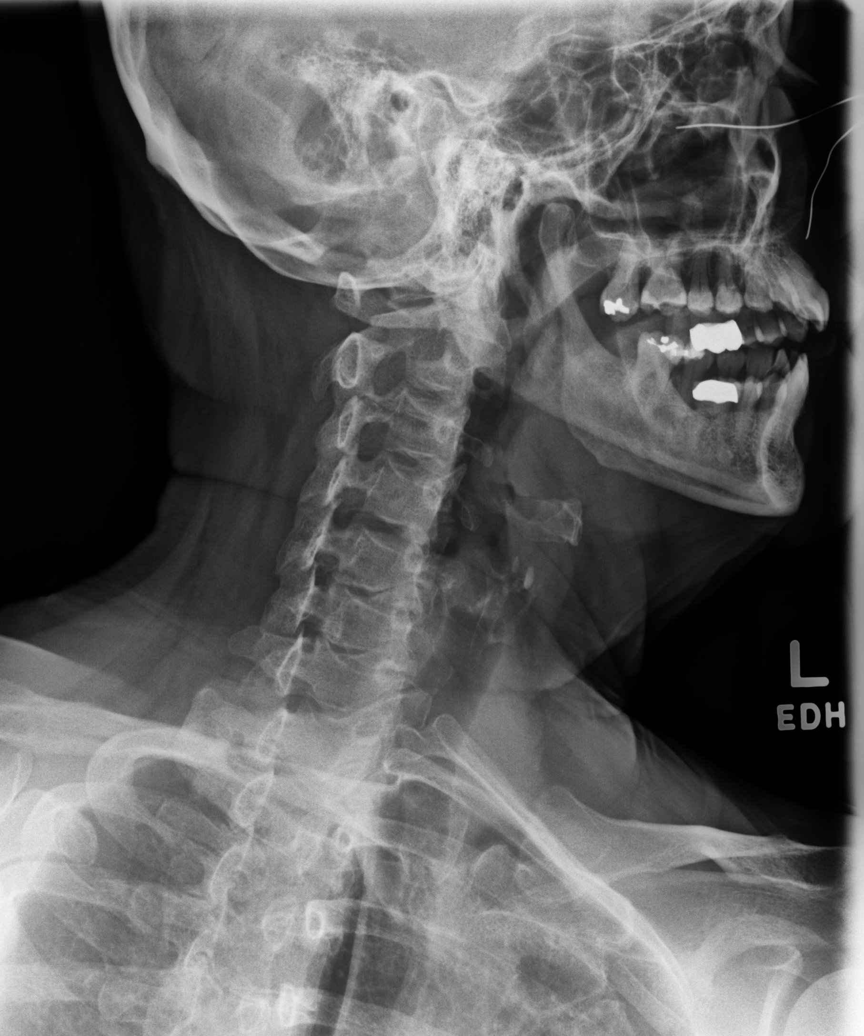

[dg cervical spine complete (3 of 5)]
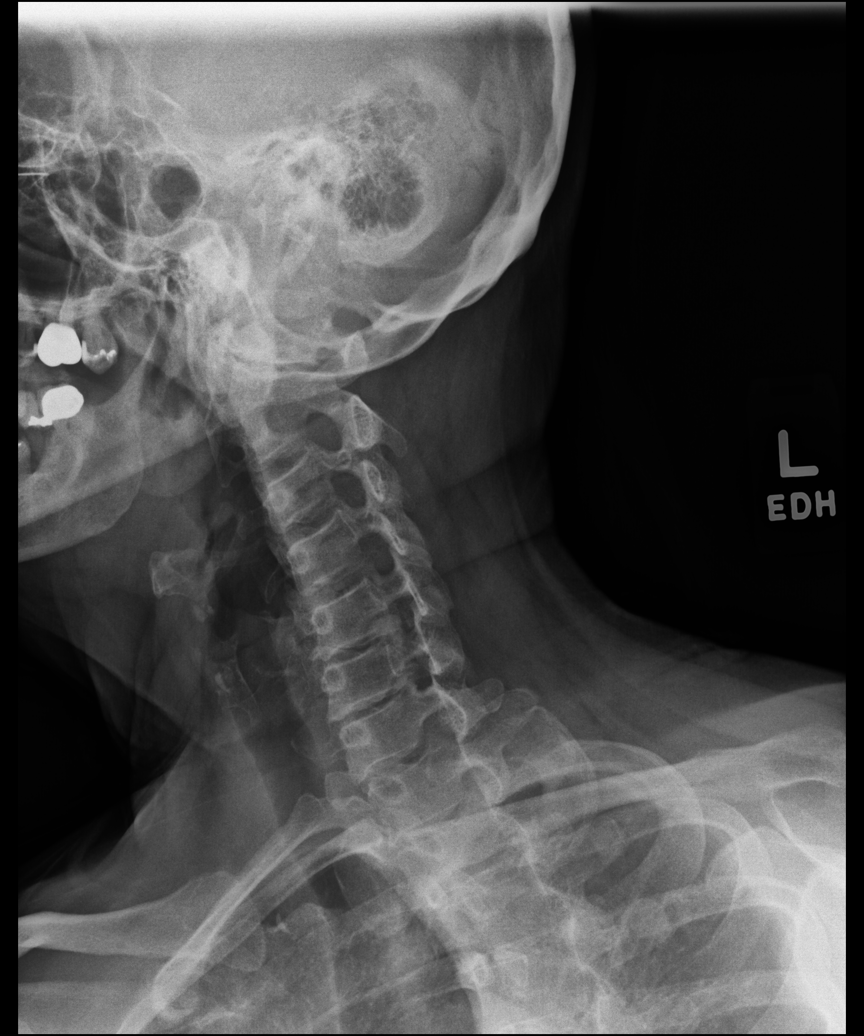

[dg cervical spine complete (4 of 5)]
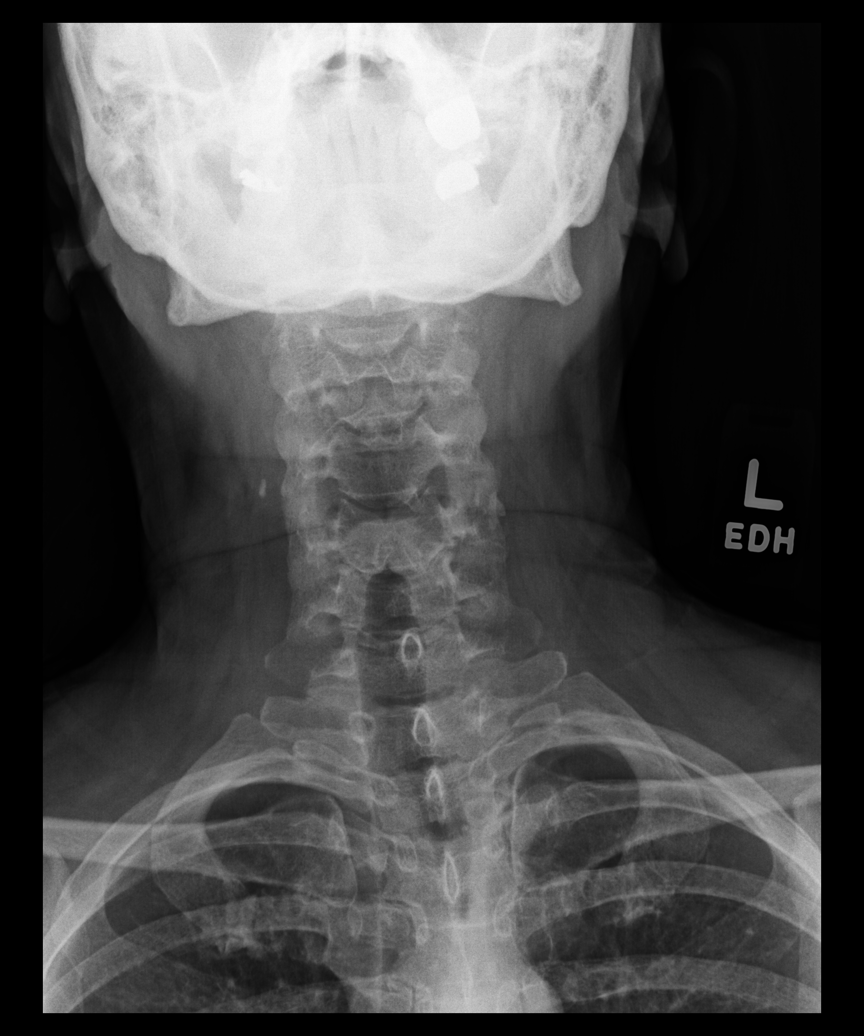

[dg cervical spine complete (5 of 5)]
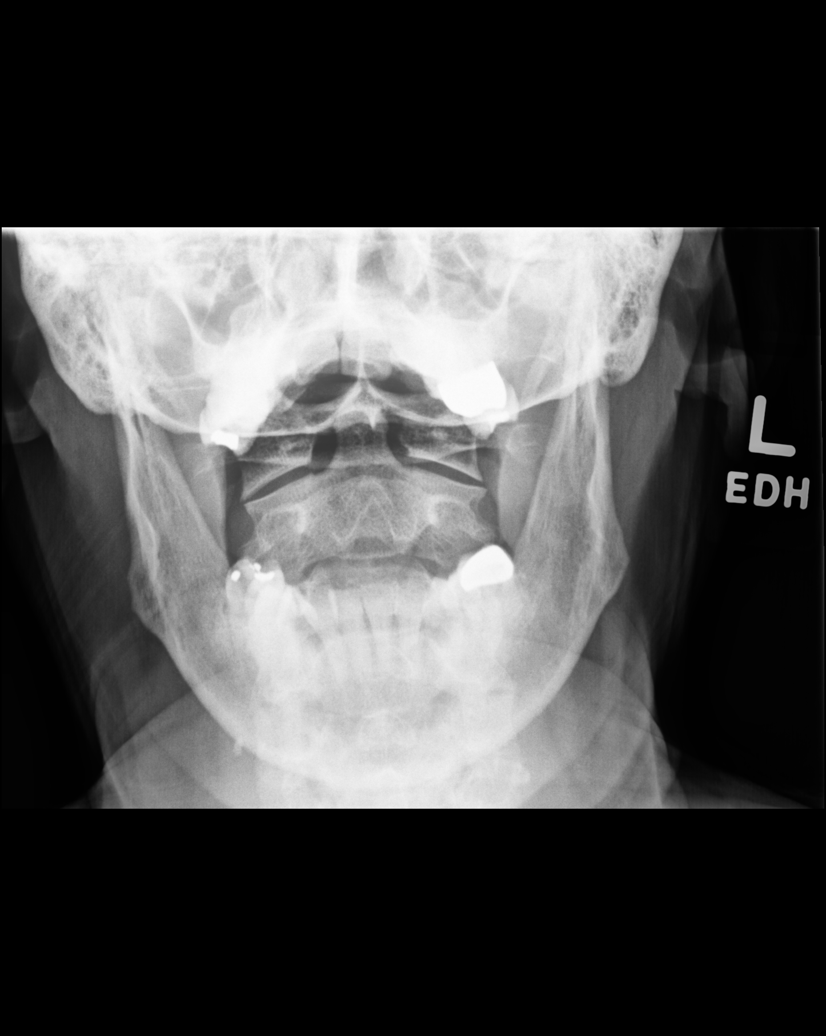

[5 of 5 positions shown; findings below may reference images not displayed]

FINDINGS: Straightening of the normal cervical lordosis. Disc space narrowing
with mild to moderate anterior and posterior spur formation at the
C5-6 level. Bilateral uncinate spurs at that level causing moderate
foraminal stenosis on the left and mild foraminal stenosis on the
right. There are also uncinate spurs at the C6-7 level causing
moderate bilateral foraminal stenosis. There is no swimmer's view
for assessing the cervicothoracic junction in the lateral
projection.
IMPRESSION: Degenerative changes at the C5-6 and C6-7 levels causing moderate
foraminal stenosis.

## 2021-03-14 ENCOUNTER — Other Ambulatory Visit: Payer: Self-pay | Admitting: Family Medicine

## 2021-05-25 ENCOUNTER — Telehealth: Payer: Self-pay | Admitting: Family Medicine

## 2021-05-25 ENCOUNTER — Encounter: Payer: Self-pay | Admitting: Nurse Practitioner

## 2021-05-25 ENCOUNTER — Other Ambulatory Visit: Payer: Self-pay

## 2021-05-25 ENCOUNTER — Telehealth (INDEPENDENT_AMBULATORY_CARE_PROVIDER_SITE_OTHER): Payer: BC Managed Care – PPO | Admitting: Nurse Practitioner

## 2021-05-25 DIAGNOSIS — J019 Acute sinusitis, unspecified: Secondary | ICD-10-CM | POA: Diagnosis not present

## 2021-05-25 DIAGNOSIS — B9689 Other specified bacterial agents as the cause of diseases classified elsewhere: Secondary | ICD-10-CM | POA: Diagnosis not present

## 2021-05-25 MED ORDER — AMOXICILLIN-POT CLAVULANATE 875-125 MG PO TABS: 1.0000 | ORAL_TABLET | Freq: Two times a day (BID) | ORAL | 0 refills | Status: AC

## 2021-05-25 NOTE — Progress Notes (Signed)
Subjective:    Patient ID: Jessica Dean, female    DOB: 03-10-1961, 60 y.o.   MRN: [id number]  HPI: Jessica Dean is a 60 y.o. female presenting virtually for sinus infection.  Chief Complaint  Patient presents with   URI   UPPER RESPIRATORY TRACT INFECTION Onset: last Wednesday  Fever: no Cough: no Shortness of breath: no Wheezing: no Chest pain: no Chest tightness: no Chest congestion: no Nasal congestion: yes Runny nose: no Post nasal drip: no Sneezing: yes Sore throat: no Swollen glands: no Sinus pressure: yes, bridge of nose Headache: yes Face pain: no Toothache: no Ear pain: no  Ear pressure: no  Eyes red/itching:no Eye drainage/crusting: no  Nausea: no  Vomiting: no Diarrhea: no  Change in appetite: no  Loss of taste/smell: no  Rash: no Fatigue: yes Sick contacts: no Strep contacts: no  Context: fluctuating  Recurrent sinusitis: no Treatments attempted: Dayquil, Nyquil, sinus/headache medicine   Relief with OTC medications: yes  Allergies  Allergen Reactions   Iohexol Hives     Code: HIVES, Desc: developed hives after injection    Neosporin [Neomycin-Bacitracin Zn-Polymyx] Rash   Tape Rash    Outpatient Encounter Medications as of 05/25/2021  Medication Sig   amoxicillin-clavulanate (AUGMENTIN) 875-125 MG tablet Take 1 tablet by mouth 2 (two) times daily for 7 days.   busPIRone (BUSPAR) 10 MG tablet TAKE 1 TABLET(10 MG) BY MOUTH TWICE DAILY   lisinopril-hydrochlorothiazide (ZESTORETIC) 20-12.5 MG tablet TAKE 1 TABLET BY MOUTH EVERY DAY   [DISCONTINUED] DEXILANT 60 MG capsule TAKE 1 CAPSULE(60 MG) BY MOUTH DAILY   No facility-administered encounter medications on file as of 05/25/2021.    Patient Active Problem List   Diagnosis Date Noted   Achalasia-status post Heller myotomy 12/31/2013   Esophageal reflux 12/31/2013   Breast cancer ([place]) 08/23/2006    Past Medical History:  Diagnosis Date   Achalasia    Anemia    Breast  cancer ([place]) 08/2006   left T1bN0Mx stage 1- s/p bilateral mastectomy TRAM flap reconstruction   Colon polyps    TUBULAR ADENOMAS (X2) AND HYPERPLASTIC POLYP(S   GERD (gastroesophageal reflux disease)    High risk medication use    Hyperlipidemia    Hypertension    no medication needed for 10-15 years   MVP (mitral valve prolapse)    Tinea pedis     Relevant past medical, surgical, family and social history reviewed and updated as indicated. Interim medical history since our last visit reviewed.  Review of Systems Per HPI unless specifically indicated above     Objective:    LMP 06/02/2012   Wt Readings from Last 3 Encounters:  04/27/20 198 lb (89.8 kg)  02/03/19 194 lb (88 kg)  09/04/18 192 lb (87.1 kg)    Physical Exam Vitals and nursing note reviewed.  Constitutional:      General: She is not in acute distress.    Appearance: Normal appearance. She is not toxic-appearing.  HENT:     Head: Normocephalic and atraumatic.     Nose: Congestion present. No rhinorrhea.     Mouth/Throat:     Mouth: Mucous membranes are moist.     Pharynx: Oropharynx is clear.  Eyes:     General: No scleral icterus.    Extraocular Movements: Extraocular movements intact.  Cardiovascular:     Comments: Unable to assess heart sounds via virtual visit. Pulmonary:     Effort: Pulmonary effort is normal. No respiratory distress.  Comments: Unable to assess lung sounds via virtual visit.  Patient talking in complete sentences during telemedicine visit. Skin:    Coloration: Skin is not jaundiced or pale.     Findings: No erythema.  Neurological:     Mental Status: She is alert and oriented to person, place, and time.  Psychiatric:        Mood and Affect: Mood normal.        Behavior: Behavior normal.        Thought Content: Thought content normal.        Judgment: Judgment normal.       Assessment & Plan:  1. Acute bacterial sinusitis Acute x10 days.  Suspect bacterial sinus  infection.  Start Augmentin twice daily for 7 days.  Encouraged pushing fluids, nasal rinses.  Follow-up if symptoms do not improve after antibiotics.  - amoxicillin-clavulanate (AUGMENTIN) 875-125 MG tablet; Take 1 tablet by mouth 2 (two) times daily for 7 days.  Dispense: 14 tablet; Refill: 0  Follow up plan: Return if symptoms worsen or fail to improve.   Due to the catastrophic nature of the COVID-19 pandemic, this video visit was completed soley via audio and visual contact via Careg[facility] due to the restrictions of the COVID-19 pandemic.  All issues as above were discussed and addressed. Physical exam was done as above through visual confirmation on Caregility. If it was felt that the patient should be evaluated in the office, they were directed there. The patient verbally consented to this visit. Location of the patient: home Location of the provider: work Those involved with this call:  Provider: Noemi Chapel, DNP, FNP-C CMA: n/a Front Desk/Registration: Vevelyn Pat  Time spent on call:  9 minutes with patient face to face via video conference. More than 50% of this time was spent in counseling and coordination of care. 15 minutes total spent in review of patient's record and preparation of their chart. I verified patient identity using two factors (patient name and date of birth). Patient consents verbally to being seen via telemedicine visit today.

## 2021-05-25 NOTE — Telephone Encounter (Signed)
Patient stated previously spoke with [facility] billing and requested for bill to be sent to insurance company for [facility] 11/16/20. Before resolved, patient received bill from collection agency for $75.25. Patient told they would take care of it.   Patient states no additional contact made; bill for $75.25 still outstanding. Patient was covered by [facility] at that time, id #[id number].   Please advise at [phone].

## 2021-06-08 NOTE — Telephone Encounter (Signed)
I have reviewed patients account I don't see where this date of service was ever sent to [facility]. I have pulled the account back out of collections and I have added the BCBS information and I have resubmitted it to [facility]. I have spoken with patient and she verbalized understanding to disregard bill at this time and that we have sent it to [facility].

## 2021-06-19 ENCOUNTER — Other Ambulatory Visit: Payer: Self-pay | Admitting: Family Medicine

## 2021-06-20 ENCOUNTER — Other Ambulatory Visit: Payer: Self-pay | Admitting: Family Medicine

## 2021-06-20 NOTE — Telephone Encounter (Signed)
Tizanidine refill request.  Last seen 05/25/2021.  Last filled 05/31/2020.

## 2021-09-10 ENCOUNTER — Ambulatory Visit: Payer: BC Managed Care – PPO | Admitting: Family Medicine

## 2021-09-10 ENCOUNTER — Other Ambulatory Visit: Payer: Self-pay

## 2021-09-10 VITALS — BP 130/90 | HR 80 | Temp 98.3°F | Ht 67.0 in | Wt 178.4 lb

## 2021-09-10 DIAGNOSIS — H8112 Benign paroxysmal vertigo, left ear: Secondary | ICD-10-CM | POA: Diagnosis not present

## 2021-09-10 MED ORDER — MECLIZINE HCL 25 MG PO TABS: 25.0000 mg | ORAL_TABLET | Freq: Three times a day (TID) | ORAL | 0 refills | Status: DC | PRN

## 2021-09-10 NOTE — Progress Notes (Signed)
? ?Subjective:  ? ? Patient ID: Jessica Dean, female    DOB: [birth year], 61 y.o.   MRN: [id number] ?Patient reports a 4-day history of vertigo.  She states that if she sits up quickly or turns her head quickly or looks up or down suddenly, her environment will start to move.  She will feel unsteady.  She states that when she stands up she has to hold onto something to prevent from falling.  She denies any head trauma or headache.  She denies any diplopia or blurry vision.  She denies any neurologic deficit.  Specifically she denies any slurred speech, facial droop, unilateral weakness.  Her neurologic exam today is completely normal.  She has a positive Dix-Hallpike maneuver to the left. ?Past Medical History:  ?Diagnosis Date  ? Achalasia   ? Anemia   ? Breast cancer ([place]) 08/2006  ? left T1bN0Mx stage 1- s/p bilateral mastectomy TRAM flap reconstruction  ? Colon polyps   ? TUBULAR ADENOMAS (X2) AND HYPERPLASTIC POLYP(S  ? GERD (gastroesophageal reflux disease)   ? High risk medication use   ? Hyperlipidemia   ? Hypertension   ? no medication needed for 10-15 years  ? MVP (mitral valve prolapse)   ? Tinea pedis   ? ?Past Surgical History:  ?Procedure Laterality Date  ? APPENDECTOMY    ? BREAST SURGERY    ? COLONOSCOPY  07/08/2012  ? DILATATION & CURETTAGE/HYSTEROSCOPY WITH MYOSURE N/A 06/27/2016  ? Procedure: [place];  Surgeon: Princess Bruins, MD;  Location: [facility];  Service: Gynecology;  Laterality: N/A;  ? hellermyotomy    ? esophagus  ? MASTECTOMY  11/12/2006  ? bilateral, TRAM flap reconstruction  ? UPPER GASTROINTESTINAL ENDOSCOPY  07/08/2012  ? ?Current Outpatient Medications on File Prior to Visit  ?Medication Sig Dispense Refill  ? busPIRone (BUSPAR) 10 MG tablet TAKE 1 TABLET(10 MG) BY MOUTH TWICE DAILY 180 tablet 1  ? lisinopril-hydrochlorothiazide (ZESTORETIC) 20-12.5 MG tablet TAKE 1 TABLET BY MOUTH EVERY DAY 90 tablet 0  ? tiZANidine (ZANAFLEX) 2 MG tablet TAKE  1 TABLET(2 MG) BY MOUTH EVERY 8 HOURS AS NEEDED FOR MUSCLE SPASMS 30 tablet 0  ? ?No current facility-administered medications on file prior to visit.  ? ?Allergies  ?Allergen Reactions  ? Iohexol Hives  ?   Code: HIVES, Desc: developed hives after injection ?  ? Neosporin [Neomycin-Bacitracin Zn-Polymyx] Rash  ? Tape Rash  ? ?Social History  ? ?Socioeconomic History  ? Marital status: Legally Separated  ?  Spouse name: Not on file  ? Number of children: 2  ? Years of education: Not on file  ? Highest education level: Not on file  ?Occupational History  ? Occupation: Human resources officer in [place]  ?  Employer: [employer]  ?Tobacco Use  ? Smoking status: Former  ?  Packs/day: 1.00  ?  Years: 35.00  ?  Pack years: 35.00  ?  Types: Cigarettes  ? Smokeless tobacco: Never  ? Tobacco comments:  ?  Pt is vaping  ?Substance and Sexual Activity  ? Alcohol use: Yes  ? Drug use: No  ? Sexual activity: Not on file  ?Other Topics Concern  ? Not on file  ?Social History Narrative  ? Currently separated as of January 2014  ? 2 daughters  ? Earnie Larsson as a Solicitor for [employer]  ? ?Social Determinants of Health  ? ?Financial Resource Strain: Not on file  ?Food Insecurity: Not  on file  ?Transportation Needs: Not on file  ?Physical Activity: Not on file  ?Stress: Not on file  ?Social Connections: Not on file  ?Intimate Partner Violence: Not on file  ? ?Family History  ?Problem Relation Age of Onset  ? Hypertension Mother   ? Breast cancer Mother 18  ?     died age 43  ? Hypertension Paternal Grandfather   ? Hypertension Maternal Grandfather   ? Cancer Maternal Grandfather   ? ? ? ? ?Review of Systems  ?All other systems reviewed and are negative. ? ?   ?Objective:  ? Physical Exam ?Vitals reviewed.  ?Constitutional:   ?   General: She is not in acute distress. ?   Appearance: She is well-developed. She is not ill-appearing, toxic-appearing or diaphoretic.  ?HENT:  ?   Head: Normocephalic and atraumatic.   ?   Right Ear: Tympanic membrane and ear canal normal.  ?   Left Ear: Tympanic membrane and ear canal normal.  ?   Nose: Nose normal. No congestion or rhinorrhea.  ?Neck:  ?   Thyroid: No thyromegaly.  ?   Vascular: No JVD.  ?   Trachea: No tracheal deviation.  ?Cardiovascular:  ?   Rate and Rhythm: Normal rate and regular rhythm.  ?   Heart sounds: Normal heart sounds. No murmur heard. ?  No friction rub. No gallop.  ?Pulmonary:  ?   Effort: Pulmonary effort is normal. No respiratory distress.  ?   Breath sounds: Normal breath sounds. No stridor. No wheezing or rales.  ?Chest:  ?   Chest wall: No tenderness.  ?Musculoskeletal:  ?   Cervical back: Neck supple.  ?Lymphadenopathy:  ?   Cervical: No cervical adenopathy.  ?Neurological:  ?   General: No focal deficit present.  ?   Mental Status: She is alert and oriented to person, place, and time.  ?   Cranial Nerves: No cranial nerve deficit.  ?   Sensory: No sensory deficit.  ?   Motor: No weakness or abnormal muscle tone.  ?   Coordination: Coordination normal.  ?   Gait: Gait normal.  ?   Deep Tendon Reflexes: Reflexes are normal and symmetric. Reflexes normal.  ? ? ? ? ? ?   ?Assessment & Plan:  ?Benign paroxysmal positional vertigo of left ear ?Patient appears to have benign paroxysmal positional vertigo.  Begin meclizine 25 mg every 8 hours as needed.  Recheck in 1 week if no better or sooner if worse.  We discussed Epley maneuvers. ?

## 2021-09-19 ENCOUNTER — Other Ambulatory Visit: Payer: Self-pay | Admitting: Family Medicine

## 2021-10-08 DIAGNOSIS — Z853 Personal history of malignant neoplasm of breast: Secondary | ICD-10-CM | POA: Insufficient documentation

## 2022-01-04 LAB — HM COLONOSCOPY

## 2022-01-15 ENCOUNTER — Other Ambulatory Visit: Payer: Self-pay | Admitting: Family Medicine

## 2022-01-15 NOTE — Telephone Encounter (Signed)
Requested medication (s) are due for refill today - provider review   Requested medication (s) are on the active medication list -yes  Future visit scheduled -yes  Last refill: 06/21/22 #30  Notes to clinic: non delegated Rx  Requested Prescriptions  Pending Prescriptions Disp Refills   tiZANidine (ZANAFLEX) 2 MG tablet [Pharmacy Med Name: TIZANIDINE '2MG'$  TABLETS] 30 tablet 0    Sig: TAKE 1 TABLET(2 MG) BY MOUTH EVERY 8 HOURS AS NEEDED FOR MUSCLE SPASMS     Not Delegated - Cardiovascular:  Alpha-2 Agonists - tizanidine Failed - 01/15/2022  8:52 AM      Failed - This refill cannot be delegated      Passed - Valid encounter within last 6 months    Recent Outpatient Visits           4 months ago Benign paroxysmal positional vertigo of left ear   [place] Susy Frizzle, MD   7 months ago Acute bacterial sinusitis   [place] Eulogio Bear, NP   1 year ago Herpes zoster without complication   [place] Eulogio Bear, NP   1 year ago Cervical radiculopathy   [place] [facility]enn[facility] Schaumann, Cammie Mcgee, MD   2 years ago Left sided sciatica   [place] Pickard, Cammie Mcgee, MD       Future Appointments             In 6 days Pickard, Cammie Mcgee, MD [place]               Requested Prescriptions  Pending Prescriptions Disp Refills   tiZANidine (ZANAFLEX) 2 MG tablet [Pharmacy Med Name: TIZANIDINE '2MG'$  TABLETS] 30 tablet 0    Sig: TAKE 1 TABLET(2 MG) BY MOUTH EVERY 8 HOURS AS NEEDED FOR MUSCLE SPASMS     Not Delegated - Cardiovascular:  Alpha-2 Agonists - tizanidine Failed - 01/15/2022  8:52 AM      Failed - This refill cannot be delegated      Passed - Valid encounter within last 6 months    Recent Outpatient Visits           4 months ago Benign paroxysmal positional vertigo of left ear   [place] Susy Frizzle, MD   7 months ago  Acute bacterial sinusitis   [place] Eulogio Bear, NP   1 year ago Herpes zoster without complication   [place] Eulogio Bear, NP   1 year ago Cervical radiculopathy   [place] Susy Frizzle, MD   2 years ago Left sided sciatica   [place] Pickard, Cammie Mcgee, MD       Future Appointments             In 6 days Pickard, Cammie Mcgee, MD [place], [facility]

## 2022-01-21 ENCOUNTER — Ambulatory Visit: Payer: BC Managed Care – PPO | Admitting: Family Medicine

## 2022-01-21 ENCOUNTER — Telehealth: Payer: Self-pay

## 2022-01-21 ENCOUNTER — Ambulatory Visit (HOSPITAL_COMMUNITY)
Admission: RE | Admit: 2022-01-21 | Discharge: 2022-01-21 | Disposition: A | Discharge status: Home / Self Care | Payer: BC Managed Care – PPO | Source: Ambulatory Visit | Attending: Family Medicine | Admitting: Family Medicine

## 2022-01-21 ENCOUNTER — Other Ambulatory Visit: Payer: Self-pay | Admitting: Family Medicine

## 2022-01-21 ENCOUNTER — Encounter: Payer: Self-pay | Admitting: Family Medicine

## 2022-01-21 ENCOUNTER — Inpatient Hospital Stay: Payer: Self-pay

## 2022-01-21 ENCOUNTER — Other Ambulatory Visit: Payer: Self-pay

## 2022-01-21 VITALS — BP 132/82 | HR 81 | Temp 97.7°F | Ht 67.0 in | Wt 183.0 lb

## 2022-01-21 DIAGNOSIS — M7989 Other specified soft tissue disorders: Secondary | ICD-10-CM | POA: Diagnosis not present

## 2022-01-21 DIAGNOSIS — Z853 Personal history of malignant neoplasm of breast: Secondary | ICD-10-CM

## 2022-01-21 DIAGNOSIS — I890 Lymphedema, not elsewhere classified: Secondary | ICD-10-CM

## 2022-01-21 NOTE — Addendum Note (Signed)
Addended by: Colman Cater on: 01/21/2022 01:29 PM   Modules accepted: Orders

## 2022-01-21 NOTE — Telephone Encounter (Signed)
Spoke w/pt today regarding Ultrasound results.   Per pt, she would like to know before going for a CT, would to know if they can do some lab draws to see if there is any infection going on?  Pls advice?

## 2022-01-21 NOTE — Progress Notes (Signed)
Subjective:    Patient ID: Jessica Dean, female    DOB: 05-29-1961, 61 y.o.   MRN: [id number]  Patient reports sudden onset of swelling in her left arm around July 14.  The left arm is markedly swollen distal to the axilla compared to the right arm.  There is pitting edema in her forearm.  She states that this began after she had an IV placed for a colonoscopy.  She does have some tightness and pain in her left arm.  She has been taking ibuprofen and Tylenol for back pain.  She has a history of left-sided breast cancer and had a bilateral mastectomy but this was 15 years ago and she has had no lymphedema in the left arm since that time. Past Medical History:  Diagnosis Date   Achalasia    Anemia    Breast cancer ([place]) 08/2006   left T1bN0Mx stage 1- s/p bilateral mastectomy TRAM flap reconstruction   Colon polyps    TUBULAR ADENOMAS (X2) AND HYPERPLASTIC POLYP(S   GERD (gastroesophageal reflux disease)    High risk medication use    Hyperlipidemia    Hypertension    no medication needed for 10-15 years   MVP (mitral valve prolapse)    Tinea pedis    Past Surgical History:  Procedure Laterality Date   APPENDECTOMY     BREAST SURGERY     COLONOSCOPY  07/08/2012   DILATATION & CURETTAGE/HYSTEROSCOPY WITH MYOSURE N/A 06/27/2016   Procedure: [place];  Surgeon: Princess Bruins, MD;  Location: [facility];  Service: Gynecology;  Laterality: N/A;   hellermyotomy     esophagus   MASTECTOMY  11/12/2006   bilateral, TRAM flap reconstruction   UPPER GASTROINTESTINAL ENDOSCOPY  07/08/2012   Current Outpatient Medications on File Prior to Visit  Medication Sig Dispense Refill   busPIRone (BUSPAR) 10 MG tablet TAKE 1 TABLET(10 MG) BY MOUTH TWICE DAILY 180 tablet 1   lisinopril-hydrochlorothiazide (ZESTORETIC) 20-12.5 MG tablet TAKE 1 TABLET BY MOUTH EVERY DAY 90 tablet 3   tiZANidine (ZANAFLEX) 2 MG tablet TAKE 1 TABLET(2 MG) BY MOUTH EVERY 8 HOURS AS  NEEDED FOR MUSCLE SPASMS 30 tablet 0   meclizine (ANTIVERT) 25 MG tablet Take 1 tablet (25 mg total) by mouth 3 (three) times daily as needed for dizziness. (Patient not taking: Reported on 01/21/2022) 30 tablet 0   No current facility-administered medications on file prior to visit.   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[place]: [employer] Use   Smoking status: Former    Packs/day: 1.00    Years: 35.00    Total pack years: 35.00    Types: Cigarettes   Smokeless tobacco: Never   Tobacco comments:    Pt is vaping  Substance and Sexual Activity   Alcohol use: Yes   Drug use: No   Sexual activity: Not on file  Other Topics Concern   Not on file  Social History Narrative   Currently separated as of January  2014   2 daughters   Earnie Larsson as a Solicitor for [employer]   Social Determinants of Health   Financial Resource Strain: Not on file  Food Insecurity: Not on file  Transportation Needs: Not on file  Physical Activity: Not on file  Stress: Not on file  Social Connections: Not on file  Intimate Partner Violence: Not on file   Family History  Problem Relation Age of Onset   Hypertension Mother    Breast cancer Mother 60       died age 98   Hypertension Paternal Grandfather    Hypertension Maternal Grandfather    Cancer Maternal Grandfather       Review of Systems  All other systems reviewed and are negative.      Objective:   Physical Exam Vitals reviewed.  Constitutional:      General: She is not in acute distress.     Appearance: She is well-developed. She is not diaphoretic.  Neck:     Thyroid: No thyromegaly.     Vascular: No JVD.     Trachea: No tracheal deviation.  Cardiovascular:     Rate and Rhythm: Normal rate and regular rhythm.     Heart sounds: Normal heart sounds. No murmur heard.    No friction rub. No gallop.  Pulmonary:     Effort: Pulmonary effort is normal. No respiratory distress.     Breath sounds: Normal breath sounds. No stridor. No wheezing or rales.  Chest:     Chest wall: No tenderness.  Musculoskeletal:     Cervical back: Neck supple.  Lymphadenopathy:     Cervical: No cervical adenopathy.  Neurological:     Mental Status: She is alert.     Motor: No abnormal muscle tone.     Deep Tendon Reflexes: Reflexes are normal and symmetric.     Left arm is markedly swollen compared to the right arm.  There is edema in the left arm distal to the axilla.  She has palpable radial and ulnar pulses at the wrist.  The arm is neurovascularly intact.      Assessment & Plan:  Left arm swelling - Plan: VAS [place] UPPER EXTREMITY VENOUS DUPLEX I am concerned about a DVT in the left arm.  There is marked asymmetric swelling in the left upper extremity compared to the right.  I will send the patient for a stat ultrasound of the left arm to evaluate.  If so she will need Xarelto 15 mg twice daily for 21 days followed by 20 mg a day thereafter for a total of 3 months.  I have recommended avoiding all NSAIDs.  I recommended strenuous or dangerous activity while taking blood thinners.  The patient is prepared for this diagnosis however she will not start the medication until we see the results of the ultrasound.  She is being scheduled for the ultrasound today

## 2022-01-21 NOTE — Addendum Note (Signed)
Addended by: Colman Cater on: 01/21/2022 01:21 PM   Modules accepted: Orders

## 2022-01-22 ENCOUNTER — Telehealth: Payer: Self-pay

## 2022-01-22 NOTE — Telephone Encounter (Signed)
Mariam w/[facility] Called stated that they can not the procedure. They would a specific part of the arm.

## 2022-01-31 NOTE — Telephone Encounter (Signed)
Spoke w/pt, coming in on Friday 02/01/22 at [place] on the [facility]

## 2022-02-01 ENCOUNTER — Ambulatory Visit: Payer: BC Managed Care – PPO | Admitting: Family Medicine

## 2022-02-01 VITALS — BP 140/78 | HR 75 | Temp 97.6°F | Ht 67.0 in | Wt 183.0 lb

## 2022-02-01 DIAGNOSIS — R590 Localized enlarged lymph nodes: Secondary | ICD-10-CM

## 2022-02-01 LAB — CBC WITH DIFFERENTIAL/PLATELET
Absolute Monocytes: 666 cells/uL (ref 200–950)
Basophils Absolute: 41 cells/uL (ref 0–200)
Basophils Relative: 0.6 %
Eosinophils Absolute: 252 cells/uL (ref 15–500)
Eosinophils Relative: 3.7 %
HCT: 40.3 % (ref 35.0–45.0)
Hemoglobin: 13.8 g/dL (ref 11.7–15.5)
Lymphs Abs: 1924 cells/uL (ref 850–3900)
MCH: 31.6 pg (ref 27.0–33.0)
MCHC: 34.2 g/dL (ref 32.0–36.0)
MCV: 92.2 fL (ref 80.0–100.0)
MPV: 10.4 fL (ref 7.5–12.5)
Monocytes Relative: 9.8 %
Neutro Abs: 3917 cells/uL (ref 1500–7800)
Neutrophils Relative %: 57.6 %
Platelets: 358 10*3/uL (ref 140–400)
RBC: 4.37 10*6/uL (ref 3.80–5.10)
RDW: 12.3 % (ref 11.0–15.0)
Total Lymphocyte: 28.3 %
WBC: 6.8 10*3/uL (ref 3.8–10.8)

## 2022-02-01 NOTE — Telephone Encounter (Signed)
Spoke with Jenny Reichmann w/[facility]  Called to confirm that they have received the referral for pt's CT and will be calling pt immediately to make an appt. Stated that he has some available time next week.   [phone] press Option 1 Fax [phone]

## 2022-02-01 NOTE — Progress Notes (Signed)
Subjective:    Patient ID: Jessica Dean, female    DOB: 07-12-1960, 61 y.o.   MRN: [id number] 01/21/22 Patient reports sudden onset of swelling in her left arm around July 14.  The left arm is markedly swollen distal to the axilla compared to the right arm.  There is pitting edema in her forearm.  She states that this began after she had an IV placed for a colonoscopy.  She does have some tightness and pain in her left arm.  She has been taking ibuprofen and Tylenol for back pain.  She has a history of left-sided breast cancer and had a bilateral mastectomy but this was 15 years ago and she has had no lymphedema in the left arm since that time.  At that time, my plan was: I am concerned about a DVT in the left arm.  There is marked asymmetric swelling in the left upper extremity compared to the right.  I will send the patient for a stat ultrasound of the left arm to evaluate.  If so she will need Xarelto 15 mg twice daily for 21 days followed by 20 mg a day thereafter for a total of 3 months.  I have recommended avoiding all NSAIDs.  I recommended strenuous or dangerous activity while taking blood thinners.  The patient is prepared for this diagnosis however she will not start the medication until we see the results of the ultrasound.  She is being scheduled for the ultrasound today  02/01/22 Patient has noticed a lump in her left axilla.  There is about a 1.5 cm nodular area in her left axilla adjacent to the tail of the breast.  It is nonerythematous.  It is not fluctuant.  It is not hot.  It is not tender.  I believe that this is a lymph node.  The lymph edema persists in her left arm There was no DVT in left upper extremity.  Hence, I have scheduled her for CT of the chest to evaluate for lymphatic obstruction in the chest.  However, she presents today with a lump in her left axilla.  Past Medical History:  Diagnosis Date   Achalasia    Anemia    Breast cancer ([place]) 08/2006   left T1bN0Mx stage  1- s/p bilateral mastectomy TRAM flap reconstruction   Colon polyps    TUBULAR ADENOMAS (X2) AND HYPERPLASTIC POLYP(S   GERD (gastroesophageal reflux disease)    High risk medication use    Hyperlipidemia    Hypertension    no medication needed for 10-15 years   MVP (mitral valve prolapse)    Tinea pedis    Past Surgical History:  Procedure Laterality Date   APPENDECTOMY     BREAST SURGERY     COLONOSCOPY  07/08/2012   DILATATION & CURETTAGE/HYSTEROSCOPY WITH MYOSURE N/A 06/27/2016   Procedure: [place];  Surgeon: Princess Bruins, MD;  Location: [facility];  Service: Gynecology;  Laterality: N/A;   hellermyotomy     esophagus   MASTECTOMY  11/12/2006   bilateral, TRAM flap reconstruction   UPPER GASTROINTESTINAL ENDOSCOPY  07/08/2012   Current Outpatient Medications on File Prior to Visit  Medication Sig Dispense Refill   busPIRone (BUSPAR) 10 MG tablet TAKE 1 TABLET(10 MG) BY MOUTH TWICE DAILY 180 tablet 1   lisinopril-hydrochlorothiazide (ZESTORETIC) 20-12.5 MG tablet TAKE 1 TABLET BY MOUTH EVERY DAY 90 tablet 3   meclizine (ANTIVERT) 25 MG tablet Take 1 tablet (25 mg total) by mouth  3 (three) times daily as needed for dizziness. 30 tablet 0   tiZANidine (ZANAFLEX) 2 MG tablet TAKE 1 TABLET(2 MG) BY MOUTH EVERY 8 HOURS AS NEEDED FOR MUSCLE SPASMS 30 tablet 0   No current facility-administered medications on file prior to visit.   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[place]: [employer]  Tobacco Use   Smoking status: Former     Packs/day: 1.00    Years: 35.00    Total pack years: 35.00    Types: Cigarettes   Smokeless tobacco: Never   Tobacco comments:    Pt is vaping  Substance and Sexual Activity   Alcohol use: Yes   Drug use: No   Sexual activity: Not on file  Other Topics Concern   Not on file  Social History Narrative   Currently separated as of January 2014   2 daughters   Earnie Larsson as a Solicitor for [employer]   Social Determinants of Health   Financial Resource Strain: Not on file  Food Insecurity: Not on file  Transportation Needs: Not on file  Physical Activity: Not on file  Stress: Not on file  Social Connections: Not on file  Intimate Partner Violence: Not on file   Family History  Problem Relation Age of Onset   Hypertension Mother    Breast cancer Mother 87       died age 61   Hypertension Paternal Grandfather    Hypertension Maternal Grandfather    Cancer Maternal Grandfather       Review of Systems  All other systems reviewed and are negative.      Objective:   Physical Exam Vitals reviewed.  Constitutional:      General: She is not in acute distress.    Appearance: She is well-developed. She is not diaphoretic.  Neck:     Thyroid: No thyromegaly.     Vascular: No JVD.     Trachea: No tracheal deviation.  Cardiovascular:     Rate and Rhythm: Normal rate and regular rhythm.     Heart sounds: Normal heart sounds. No murmur heard.    No friction rub. No gallop.  Pulmonary:     Effort: Pulmonary effort is normal. No respiratory distress.     Breath sounds: Normal breath sounds. No stridor. No wheezing or rales.  Chest:     Chest wall: No tenderness.  Musculoskeletal:     Cervical back: Neck supple.  Lymphadenopathy:     Cervical: No cervical adenopathy.  Neurological:     Mental Status: She is alert.     Motor: No abnormal muscle tone.     Deep Tendon Reflexes: Reflexes are normal and symmetric.     Left arm is markedly swollen  compared to the right arm.  There is edema in the left arm distal to the axilla.  She has palpable radial and ulnar pulses at the wrist.  The arm is neurovascularly intact.      Assessment & Plan:  Lymphadenopathy, axillary -  Plan: US BREAST LTD UNI LEFT INC AXILLA, CBC with Differential/Platelet Patient has lymphadenopathy in left axilla. I recommended getting an ultrasound of the axilla to evaluate further however my concern still remains that there is some type of blockage in the lymphatic system causing the lymphedema in the left arm.  This could certainly be lymphedema related to her previous breast cancer surgery however it would be odd to develop 15 years after the fact.  Therefore we will do CT scan to rule out obstruction.  We will call today and verify the appointment.  I am not sure why this has not been scheduled yet as the information has been sent to [facility].  Therefore we will verbally confirm with him an appointment.  Check CBC to evaluate for leukocytosis

## 2022-02-05 ENCOUNTER — Telehealth: Payer: Self-pay

## 2022-02-05 NOTE — Telephone Encounter (Signed)
Spoke with pt this morning, CT is schedule for this Thursday in the morning. Asked pt to let Rad dept sent over results to as soon as possible.

## 2022-02-07 ENCOUNTER — Encounter: Payer: Self-pay | Admitting: Family Medicine

## 2022-02-13 ENCOUNTER — Telehealth: Payer: Self-pay

## 2022-02-13 NOTE — Telephone Encounter (Signed)
Pt called office this morning stating that incorrect exam was ordered at [facility]. Pt was scheduled for a vascular ultrasound and not a breast ultrasound. I apologized to patient. I was able to get in touch with [facility] to see if pt could have u/s done today. I spoke with Lelan Pons in [facility] scheduling and she requested order be faxed to them and they would try to get the patient in today. I also called [facility] and had patient's recent mammogram faxed to [place], at patient's request. I spoke with patient again and advised of all and again apologized for the error in scheduling. Pt verbalized understanding.

## 2022-02-14 ENCOUNTER — Telehealth: Payer: Self-pay

## 2022-02-14 DIAGNOSIS — R590 Localized enlarged lymph nodes: Secondary | ICD-10-CM | POA: Insufficient documentation

## 2022-02-14 NOTE — Telephone Encounter (Signed)
Spoke with patient this morning and pt has breast u/s scheduled for 02/26/2022 at [facility].  Pt states that she has already had her chest CT and would like you to review her results, when you can. Thank you.

## 2022-02-18 ENCOUNTER — Other Ambulatory Visit: Payer: Self-pay | Admitting: Family Medicine

## 2022-02-18 MED ORDER — PREDNISONE 20 MG PO TABS: ORAL_TABLET | ORAL | 0 refills | Status: DC

## 2022-02-27 ENCOUNTER — Encounter: Payer: Self-pay | Admitting: Family Medicine

## 2022-02-27 LAB — HM MAMMOGRAPHY

## 2022-03-04 ENCOUNTER — Other Ambulatory Visit: Payer: Self-pay | Admitting: Family Medicine

## 2022-03-04 DIAGNOSIS — I890 Lymphedema, not elsewhere classified: Secondary | ICD-10-CM

## 2022-03-05 ENCOUNTER — Other Ambulatory Visit: Payer: Self-pay | Admitting: Family Medicine

## 2022-03-06 NOTE — Telephone Encounter (Signed)
Requested Prescriptions  Pending Prescriptions Disp Refills  . busPIRone (BUSPAR) 10 MG tablet [Pharmacy Med Name: BUSPIRONE '10MG'$  TABLETS] 180 tablet 1    Sig: TAKE 1 TABLET(10 MG) BY MOUTH TWICE DAILY     Psychiatry: Anxiolytics/Hypnotics - Non-controlled Passed - 03/05/2022  9:11 PM      Passed - Valid encounter within last 12 months    Recent Outpatient Visits          5 months ago Benign paroxysmal positional vertigo of left ear   [place] Susy Frizzle, MD   9 months ago Acute bacterial sinusitis   [place] Eulogio Bear, NP   1 year ago Herpes zoster without complication   [place] Eulogio Bear, NP   1 year ago Cervical radiculopathy   [place] Dennard Schaumann, Cammie Mcgee, MD   3 years ago Left sided sciatica   [place] Dennard Schaumann, Cammie Mcgee, MD             . tiZANidine (ZANAFLEX) 2 MG tablet [Pharmacy Med Name: TIZANIDINE '2MG'$  TABLETS] 30 tablet 0    Sig: TAKE 1 TABLET(2 MG) BY MOUTH EVERY 8 HOURS AS NEEDED FOR MUSCLE SPASMS     Not Delegated - Cardiovascular:  Alpha-2 Agonists - tizanidine Failed - 03/05/2022  9:11 PM      Failed - This refill cannot be delegated      Passed - Valid encounter within last 6 months    Recent Outpatient Visits          5 months ago Benign paroxysmal positional vertigo of left ear   [place] Susy Frizzle, MD   9 months ago Acute bacterial sinusitis   [place] Eulogio Bear, NP   1 year ago Herpes zoster without complication   [place] Eulogio Bear, NP   1 year ago Cervical radiculopathy   [place], Cammie Mcgee, MD   3 years ago Left sided sciatica   [place] Pickard, Cammie Mcgee, MD

## 2022-03-06 NOTE — Telephone Encounter (Signed)
Requested medication (s) are due for refill today: yes  Requested medication (s) are on the active medication list: yes  Last refill:  01/15/22 #30 with 0 RF  Future visit scheduled: no, seen 02/01/22  Notes to clinic:  This medication can not be delegated, please assess.        Requested Prescriptions  Pending Prescriptions Disp Refills   tiZANidine (ZANAFLEX) 2 MG tablet [Pharmacy Med Name: TIZANIDINE '2MG'$  TABLETS] 30 tablet 0    Sig: TAKE 1 TABLET(2 MG) BY MOUTH EVERY 8 HOURS AS NEEDED FOR MUSCLE SPASMS     Not Delegated - Cardiovascular:  Alpha-2 Agonists - tizanidine Failed - 03/05/2022  9:11 PM      Failed - This refill cannot be delegated      Passed - Valid encounter within last 6 months    Recent Outpatient Visits           5 months ago Benign paroxysmal positional vertigo of left ear   [place] Susy Frizzle, MD   9 months ago Acute bacterial sinusitis   [place] Eulogio Bear, NP   1 year ago Herpes zoster without complication   [place] Eulogio Bear, NP   1 year ago Cervical radiculopathy   [place] Dennard Schaumann, Cammie Mcgee, MD   3 years ago Left sided sciatica   [place] Dennard Schaumann, Cammie Mcgee, MD              Signed Prescriptions Disp Refills   busPIRone (BUSPAR) 10 MG tablet 180 tablet 1    Sig: TAKE 1 TABLET(10 MG) BY MOUTH TWICE DAILY     Psychiatry: Anxiolytics/Hypnotics - Non-controlled Passed - 03/05/2022  9:11 PM      Passed - Valid encounter within last 12 months    Recent Outpatient Visits           5 months ago Benign paroxysmal positional vertigo of left ear   [place] Susy Frizzle, MD   9 months ago Acute bacterial sinusitis   [place] Eulogio Bear, NP   1 year ago Herpes zoster without complication   [place] Eulogio Bear, NP   1 year ago Cervical  radiculopathy   [place] Dennard Schaumann Cammie Mcgee, MD   3 years ago Left sided sciatica   [place] Pickard, Cammie Mcgee, MD

## 2022-03-14 ENCOUNTER — Encounter: Payer: Self-pay | Admitting: Family Medicine

## 2022-03-15 NOTE — Addendum Note (Signed)
Addended by: Colman Cater on: 03/15/2022 02:28 PM   Modules accepted: Orders

## 2022-03-15 NOTE — Progress Notes (Unsigned)
Office visit w/pcp

## 2022-04-30 ENCOUNTER — Ambulatory Visit: Payer: BC Managed Care – PPO | Admitting: Family Medicine

## 2022-04-30 VITALS — BP 128/72 | HR 81 | Ht 67.0 in | Wt 191.0 lb

## 2022-04-30 DIAGNOSIS — M5136 Other intervertebral disc degeneration, lumbar region: Secondary | ICD-10-CM | POA: Diagnosis not present

## 2022-04-30 MED ORDER — MELOXICAM 15 MG PO TABS: 15.0000 mg | ORAL_TABLET | Freq: Every day | ORAL | 2 refills | Status: DC

## 2022-04-30 NOTE — Progress Notes (Signed)
Subjective:    Patient ID: Jessica Dean, female    DOB: 17-Mar-1961, 61 y.o.   MRN: [id number]  Patient presents today complaining of back pain.  Pain is located in the center of her back roughly around the level of L5.  She describes it as a constant aching pain.  If she sits for prolonged period of time it causes significant pain when she tries to stand up.  She is not having any symptoms of lumbar radiculopathy.  Specifically she denies any numbness or tingling in her legs, saddle anesthesia, or bowel or bladder incontinence.  She also complains of pain in her knees and she suspects that she has arthritis.  She had a lumbar spine x-ray in 2020 that showed grade 1 retrolisthesis of L5 on L4.  She also has degenerative disc disease at L5-S1.  I suspect that this is likely worsening Past Medical History:  Diagnosis Date   Achalasia    Anemia    Breast cancer ([place]) 08/2006   left T1bN0Mx stage 1- s/p bilateral mastectomy TRAM flap reconstruction   Colon polyps    TUBULAR ADENOMAS (X2) AND HYPERPLASTIC POLYP(S   GERD (gastroesophageal reflux disease)    High risk medication use    Hyperlipidemia    Hypertension    no medication needed for 10-15 years   MVP (mitral valve prolapse)    Tinea pedis    Past Surgical History:  Procedure Laterality Date   APPENDECTOMY     BREAST SURGERY     COLONOSCOPY  07/08/2012   DILATATION & CURETTAGE/HYSTEROSCOPY WITH MYOSURE N/A 06/27/2016   Procedure: [place];  Surgeon: Princess Bruins, MD;  Location: [facility];  Service: Gynecology;  Laterality: N/A;   hellermyotomy     esophagus   MASTECTOMY  11/12/2006   bilateral, TRAM flap reconstruction   UPPER GASTROINTESTINAL ENDOSCOPY  07/08/2012   Current Outpatient Medications on File Prior to Visit  Medication Sig Dispense Refill   busPIRone (BUSPAR) 10 MG tablet TAKE 1 TABLET(10 MG) BY MOUTH TWICE DAILY 180 tablet 1   lisinopril-hydrochlorothiazide (ZESTORETIC)  20-12.5 MG tablet TAKE 1 TABLET BY MOUTH EVERY DAY 90 tablet 3   tiZANidine (ZANAFLEX) 2 MG tablet TAKE 1 TABLET(2 MG) BY MOUTH EVERY 8 HOURS AS NEEDED FOR MUSCLE SPASMS 30 tablet 0   No current facility-administered medications on file prior to visit.   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   Smoking status: Former    Packs/day: 1.00    Years: 35.00    Total pack years: 35.00    Types: Cigarettes   Smokeless tobacco: Never   Tobacco comments:    Pt is vaping  Substance and Sexual Activity   Alcohol use: Yes   Drug use: No   Sexual activity: Not on file  Other Topics Concern   Not on file  Social History Narrative   Currently separated as of January 2014   2 daughters   Earnie Larsson as a  staph recruiter for [employer]   Social Determinants of Health   Financial Resource Strain: Not on file  Food Insecurity: Not on file  Transportation Needs: Not on file  Physical Activity: Not on file  Stress: Not on file  Social Connections: Not on file  Intimate Partner Violence: Not on file   Family History  Problem Relation Age of Onset   Hypertension Mother    Breast cancer Mother 70       died age 88   Hypertension Paternal Grandfather    Hypertension Maternal Grandfather    Cancer Maternal Grandfather       Review of Systems  All other systems reviewed and are negative.      Objective:   Physical Exam Vitals reviewed.  Constitutional:      General: She is not in acute distress.    Appearance: She is  well-developed. She is not diaphoretic.  Neck:     Thyroid: No thyromegaly.     Vascular: No JVD.     Trachea: No tracheal deviation.  Cardiovascular:     Rate and Rhythm: Normal rate and regular rhythm.     Heart sounds: Normal heart sounds. No murmur heard.    No friction rub. No gallop.  Pulmonary:     Effort: Pulmonary effort is normal. No respiratory distress.     Breath sounds: Normal breath sounds. No stridor. No wheezing or rales.  Chest:     Chest wall: No tenderness.  Musculoskeletal:     Cervical back: Neck supple.     Lumbar back: Tenderness and bony tenderness present. Decreased range of motion.       Back:  Lymphadenopathy:     Cervical: No cervical adenopathy.  Neurological:     Mental Status: She is alert.     Motor: No abnormal muscle tone.     Deep Tendon Reflexes: Reflexes are normal and symmetric.         Assessment & Plan:   DDD (degenerative disc disease), lumbar I believe this is most likely due to degenerative disc disease.  Try Tylenol 1000 mg twice daily and supplement with meloxicam 15 mg daily.  If not improving over the next month, I will proceed with an x-ray of the lumbar spine to reevaluate and potentially consider an MRI and possible steroid injections

## 2022-05-03 ENCOUNTER — Ambulatory Visit: Payer: BC Managed Care – PPO | Admitting: Family Medicine

## 2022-05-10 ENCOUNTER — Other Ambulatory Visit: Payer: Self-pay | Admitting: Family Medicine

## 2022-05-13 ENCOUNTER — Telehealth: Payer: Self-pay

## 2022-05-13 ENCOUNTER — Encounter: Payer: Self-pay | Admitting: Family Medicine

## 2022-05-13 NOTE — Telephone Encounter (Signed)
Requested medications are due for refill today.  yes  Requested medications are on the active medications list.  yes  Last refill. [id number] #30 0 rf  Future visit scheduled.   no  Notes to clinic.  Refill not delegated.    Requested Prescriptions  Pending Prescriptions Disp Refills   tiZANidine (ZANAFLEX) 2 MG tablet [Pharmacy Med Name: TIZANIDINE '2MG'$  TABLETS] 30 tablet 0    Sig: TAKE 1 TABLET(2 MG) BY MOUTH EVERY 8 HOURS AS NEEDED FOR MUSCLE SPASMS     Not Delegated - Cardiovascular:  Alpha-2 Agonists - tizanidine Failed - 05/10/2022  9:58 PM      Failed - This refill cannot be delegated      Failed - Valid encounter within last 6 months    Recent Outpatient Visits           8 months ago Benign paroxysmal positional vertigo of left ear   [place] Susy Frizzle, MD   11 months ago Acute bacterial sinusitis   [place] Eulogio Bear, NP   1 year ago Herpes zoster without complication   [place] Eulogio Bear, NP   2 years ago Cervical radiculopathy   [place] Dennard Schaumann Cammie Mcgee, MD   3 years ago Left sided sciatica   [place] Pickard, Cammie Mcgee, MD

## 2022-05-13 NOTE — Telephone Encounter (Signed)
MY CHART MESSAGE FROM PATIENT:  PT IS AWARE YOU ARE OUT OF THE OFFICE.  Good morning, I have been taking the Meloxicam daily for two weeks. While it has eased the other joint pains I was having, it has not helped my back. Would you please proceed with the next steps that we discussed? Reminder: I use [facility] for diagnostic and specialty services.

## 2022-05-14 ENCOUNTER — Other Ambulatory Visit: Payer: Self-pay

## 2022-05-20 ENCOUNTER — Other Ambulatory Visit: Payer: Self-pay | Admitting: Family Medicine

## 2022-05-20 DIAGNOSIS — M5137 Other intervertebral disc degeneration, lumbosacral region: Secondary | ICD-10-CM

## 2022-05-20 DIAGNOSIS — M545 Low back pain, unspecified: Secondary | ICD-10-CM

## 2022-05-20 DIAGNOSIS — M4310 Spondylolisthesis, site unspecified: Secondary | ICD-10-CM

## 2022-06-07 ENCOUNTER — Other Ambulatory Visit: Payer: Self-pay | Admitting: Family Medicine

## 2022-06-07 DIAGNOSIS — C801 Malignant (primary) neoplasm, unspecified: Secondary | ICD-10-CM

## 2022-06-07 DIAGNOSIS — C7951 Secondary malignant neoplasm of bone: Secondary | ICD-10-CM

## 2022-06-07 DIAGNOSIS — Z853 Personal history of malignant neoplasm of breast: Secondary | ICD-10-CM

## 2022-06-13 ENCOUNTER — Telehealth: Payer: Self-pay

## 2022-06-13 ENCOUNTER — Encounter: Payer: Self-pay | Admitting: Family Medicine

## 2022-06-13 NOTE — Telephone Encounter (Signed)
Pt's PET scan has been denied through her insurance. I called them and was able to open an appeal for a provider courtesy review. The courtesy review is going to need a letter of necessity along with progress note and prior imaging. If you can do the letter, I will handle everything else. Jessica Dean is aware and verbalized understanding of all. Thank you.

## 2022-06-21 ENCOUNTER — Telehealth: Payer: Self-pay

## 2022-06-21 NOTE — Telephone Encounter (Signed)
Faxed received from pt's insurance and her PET scan has been approved. Message sent to patient to advise. Thank you.

## 2022-06-25 ENCOUNTER — Other Ambulatory Visit: Payer: Self-pay

## 2022-06-25 ENCOUNTER — Telehealth: Payer: Self-pay

## 2022-06-25 DIAGNOSIS — C801 Malignant (primary) neoplasm, unspecified: Secondary | ICD-10-CM

## 2022-06-25 DIAGNOSIS — C7951 Secondary malignant neoplasm of bone: Secondary | ICD-10-CM

## 2022-06-25 DIAGNOSIS — Z853 Personal history of malignant neoplasm of breast: Secondary | ICD-10-CM

## 2022-06-25 NOTE — Telephone Encounter (Signed)
Pt advised of PET scan results per Dr. Samella Parr order and pt verbalized understanding of all. Pt asked to be referred to Dr. Janan Halter at [facility]. Urgent referral order sent. PET scan report faxed to Dr. Prentice Docker office at [phone].

## 2022-06-25 NOTE — Telephone Encounter (Signed)
Pt advised of PET scan results per Dr. Samella Parr order and pt verbalized understanding of all. Pt asked to be referred to Dr. Janan Halter at [facility]. Urgent referral order sent. PET scan report faxed to Dr. Prentice Docker office at [phone].

## 2022-07-03 ENCOUNTER — Other Ambulatory Visit: Payer: Self-pay | Admitting: Family Medicine

## 2022-08-31 DIAGNOSIS — C50919 Malignant neoplasm of unspecified site of unspecified female breast: Secondary | ICD-10-CM | POA: Insufficient documentation

## 2022-09-12 ENCOUNTER — Other Ambulatory Visit: Payer: Self-pay | Admitting: Family Medicine

## 2022-09-12 NOTE — Telephone Encounter (Signed)
Requested medication (s) are due for refill today - yes  Requested medication (s) are on the active medication list -yes  Future visit scheduled -no  Last refill: 09/20/21 #90 3RF  Notes to clinic: [place] 06/21/22, fails lab protocol- over 1 year-2020  Requested Prescriptions  Pending Prescriptions Disp Refills   lisinopril-hydrochlorothiazide (ZESTORETIC) 20-12.5 MG tablet [Pharmacy Med Name: LISINOPRIL-HCTZ 20/12.5MG  TABLETS] 90 tablet 3    Sig: TAKE 1 TABLET BY MOUTH EVERY DAY     Cardiovascular:  ACEI + Diuretic Combos Failed - 09/12/2022  8:20 AM      Failed - Na in normal range and within 180 days    Sodium  Date Value Ref Range Status  09/04/2018 138 135 - 146 mmol/L Final         Failed - K in normal range and within 180 days    Potassium  Date Value Ref Range Status  09/04/2018 5.0 3.5 - 5.3 mmol/L Final         Failed - Cr in normal range and within 180 days    Creat  Date Value Ref Range Status  09/04/2018 0.75 0.50 - 1.05 mg/dL Final    Comment:    For patients >76 years of age, the reference limit for Creatinine is approximately 13% higher for people identified as African-American. .          Failed - eGFR is 30 or above and within 180 days    GFR, Est African American  Date Value Ref Range Status  09/04/2018 103 > OR = 60 mL/min/1.27m2 Final   GFR, Est Non African American  Date Value Ref Range Status  09/04/2018 89 > OR = 60 mL/min/1.49m2 Final         Failed - Valid encounter within last 6 months    Recent Outpatient Visits           1 year ago Benign paroxysmal positional vertigo of left ear   [place] Susy Frizzle, MD   1 year ago Acute bacterial sinusitis   [place] Eulogio Bear, NP   1 year ago Herpes zoster without complication   [place] Eulogio Bear, NP   2 years ago Cervical radiculopathy   [place] Susy Frizzle, MD   3 years ago Left  sided sciatica   [place] Pickard, Cammie Mcgee, MD              Passed - Patient is not pregnant      Passed - Last BP in normal range    BP Readings from Last 1 Encounters:  04/30/22 128/72            Requested Prescriptions  Pending Prescriptions Disp Refills   lisinopril-hydrochlorothiazide (ZESTORETIC) 20-12.5 MG tablet [Pharmacy Med Name: LISINOPRIL-HCTZ 20/12.5MG  TABLETS] 90 tablet 3    Sig: TAKE 1 TABLET BY MOUTH EVERY DAY     Cardiovascular:  ACEI + Diuretic Combos Failed - 09/12/2022  8:20 AM      Failed - Na in normal range and within 180 days    Sodium  Date Value Ref Range Status  09/04/2018 138 135 - 146 mmol/L Final         Failed - K in normal range and within 180 days    Potassium  Date Value Ref Range Status  09/04/2018 5.0 3.5 - 5.3 mmol/L Final         Failed -  Cr in normal range and within 180 days    Creat  Date Value Ref Range Status  09/04/2018 0.75 0.50 - 1.05 mg/dL Final    Comment:    For patients >56 years of age, the reference limit for Creatinine is approximately 13% higher for people identified as African-American. .          Failed - eGFR is 30 or above and within 180 days    GFR, Est African American  Date Value Ref Range Status  09/04/2018 103 > OR = 60 mL/min/1.26m2 Final   GFR, Est Non African American  Date Value Ref Range Status  09/04/2018 89 > OR = 60 mL/min/1.43m2 Final         Failed - Valid encounter within last 6 months    Recent Outpatient Visits           1 year ago Benign paroxysmal positional vertigo of left ear   [place] Susy Frizzle, MD   1 year ago Acute bacterial sinusitis   [place] Eulogio Bear, NP   1 year ago Herpes zoster without complication   [place] Eulogio Bear, NP   2 years ago Cervical radiculopathy   [place] Dennard Schaumann Cammie Mcgee, MD   3 years ago Left sided sciatica    [place] Pickard, Cammie Mcgee, MD              Passed - Patient is not pregnant      Passed - Last BP in normal range    BP Readings from Last 1 Encounters:  04/30/22 128/72

## 2022-11-03 DIAGNOSIS — E871 Hypo-osmolality and hyponatremia: Secondary | ICD-10-CM | POA: Insufficient documentation

## 2022-11-03 DIAGNOSIS — D849 Immunodeficiency, unspecified: Secondary | ICD-10-CM | POA: Insufficient documentation

## 2022-11-03 DIAGNOSIS — D649 Anemia, unspecified: Secondary | ICD-10-CM | POA: Insufficient documentation

## 2022-11-03 DIAGNOSIS — R509 Fever, unspecified: Secondary | ICD-10-CM | POA: Insufficient documentation

## 2022-11-05 ENCOUNTER — Telehealth: Payer: Self-pay

## 2022-11-05 NOTE — Transitions of Care (Post Inpatient/ED Visit) (Signed)
   11/05/2022  Name: Jessica Dean MRN: [id number] DOB: Jul 15, 1960  Today's TOC FU Call Status: Today's TOC FU Call Status:: Successful TOC FU Call Competed TOC FU Call Complete Date: 11/05/22  Transition Care Management Follow-up Telephone Call Date of Discharge: 11/04/22 Discharge Facility: Other ([facility]) Name of Other ([facility]) Discharge Facility: [facility] Type of Discharge: Inpatient Admission How have you been since you were released from the hospital?: Better Any questions or concerns?: No  Items Reviewed: Did you receive and understand the discharge instructions provided?: Yes Medications obtained,verified, and reconciled?: Yes (Medications Reviewed) Any new allergies since your discharge?: No Dietary orders reviewed?: Yes Do you have support at home?: Yes People in Home: spouse  Medications Reviewed Today: Medications Reviewed Today     Reviewed by Karena Addison, LPN (Licensed Practical Nurse) on 11/05/22 at [id number]  Med List Status: <None>   Medication Order Taking? Sig Documenting Provider Last Dose Status Informant  azithromycin (ZITHROMAX) 500 MG tablet [id number] Yes Take 500 mg by mouth once. [provider] Taking Active   busPIRone (BUSPAR) 10 MG tablet [id number] Yes TAKE 1 TABLET(10 MG) BY MOUTH TWICE DAILY Donita Brooks, MD Taking Active   cefdinir (OMNICEF) 300 MG capsule [id number] Yes Take 300 mg by mouth daily. [provider] Taking Active   lisinopril-hydrochlorothiazide (ZESTORETIC) 20-12.5 MG tablet [id number] Yes TAKE 1 TABLET BY MOUTH EVERY DAY Donita Brooks, MD Taking Active   meloxicam (MOBIC) 15 MG tablet [id number] Yes Take 1 tablet (15 mg total) by mouth daily. Donita Brooks, MD Taking Active   tiZANidine (ZANAFLEX) 2 MG tablet [id number] Yes TAKE 1 TABLET(2 MG) BY MOUTH EVERY 8 HOURS AS NEEDED FOR MUSCLE SPASMS Donita Brooks, MD Taking Active             Home Care and Equipment/Supplies: Were Home  Health Services Ordered?: NA Any new equipment or medical supplies ordered?: NA  Functional Questionnaire: Do you need assistance with bathing/showering or dressing?: No Do you need assistance with meal preparation?: No Do you need assistance with eating?: No Do you have difficulty maintaining continence: No Do you need assistance with getting out of bed/getting out of a chair/moving?: No Do you have difficulty managing or taking your medications?: No  Follow up appointments reviewed: PCP Follow-up appointment confirmed?: NA Specialist Hospital Follow-up appointment confirmed?: Yes Date of Specialist follow-up appointment?: 11/08/22 Follow-Up Specialty Provider:: oncology Do you need transportation to your follow-up appointment?: No Do you understand care options if your condition(s) worsen?: Yes-patient verbalized understanding    SIGNATURE Karena Addison, LPN [facility] Nurse Health Advisor Direct Dial [phone]

## 2022-12-24 ENCOUNTER — Other Ambulatory Visit: Payer: Self-pay | Admitting: Family Medicine

## 2023-01-16 ENCOUNTER — Other Ambulatory Visit: Payer: Self-pay | Admitting: Family Medicine

## 2023-01-17 NOTE — Telephone Encounter (Signed)
Requested medication (s) are due for refill today: Few days early  Requested medication (s) are on the active medication list: Yes  Last refill:  12/24/22  Future visit scheduled: No  Notes to clinic:  Needs OV.    Requested Prescriptions  Pending Prescriptions Disp Refills   busPIRone (BUSPAR) 10 MG tablet [Pharmacy Med Name: BUSPIRONE 10MG  TABLETS] 60 tablet 0    Sig: TAKE 1 TABLET(10 MG) BY MOUTH TWICE DAILY     Psychiatry: Anxiolytics/Hypnotics - Non-controlled Failed - 01/16/2023  7:08 PM      Failed - Valid encounter within last 12 months    Recent Outpatient Visits           1 year ago Benign paroxysmal positional vertigo of left ear   [facility] Donita Brooks, MD   1 year ago Acute bacterial sinusitis   [facility] Valentino Nose, NP   2 years ago Herpes zoster without complication   [facility] Valentino Nose, NP   2 years ago Cervical radiculopathy   [facility] Tanya Nones Priscille Heidelberg, MD   3 years ago Left sided sciatica   [facility] Pickard, Priscille Heidelberg, MD

## 2023-02-10 ENCOUNTER — Ambulatory Visit: Payer: BC Managed Care – PPO | Admitting: Family Medicine

## 2023-02-10 ENCOUNTER — Encounter: Payer: Self-pay | Admitting: Family Medicine

## 2023-02-10 VITALS — BP 124/68 | HR 88 | Temp 98.0°F | Ht 67.0 in | Wt 196.8 lb

## 2023-02-10 DIAGNOSIS — I10 Essential (primary) hypertension: Secondary | ICD-10-CM | POA: Diagnosis not present

## 2023-02-10 DIAGNOSIS — G893 Neoplasm related pain (acute) (chronic): Secondary | ICD-10-CM

## 2023-02-10 DIAGNOSIS — R5383 Other fatigue: Secondary | ICD-10-CM | POA: Diagnosis not present

## 2023-02-10 DIAGNOSIS — Z853 Personal history of malignant neoplasm of breast: Secondary | ICD-10-CM | POA: Diagnosis not present

## 2023-02-10 DIAGNOSIS — C7951 Secondary malignant neoplasm of bone: Secondary | ICD-10-CM

## 2023-02-10 MED ORDER — FENTANYL 25 MCG/HR TD PT72
1.0000 | MEDICATED_PATCH | TRANSDERMAL | 0 refills | Status: AC
Start: 2023-02-10 — End: ?

## 2023-02-10 NOTE — Progress Notes (Signed)
Subjective:    Patient ID: Jessica Dean, female    DOB: 12/21/1960, 62 y.o.   MRN: [id number]  When I last saw the patient in November 2023, she was deemed with low back pain.  Unfortunately, workup discussed with the patient is dealing with metastatic breast cancer.  Her.  She is currently undergoing chemotherapy and is on an aromatase inhibitor.  She is dealing with constant pain in her lower back.  Oncology has been giving her oxycodone 1 pill every 4 hours however her quality of life is poor.  She would like the pain to be better controlled so that she can enjoy her life.  She is realistic with the implications of the cancer and she wants to focus on quality of life.  She would love to be able to play tennis again if the pain was better controlled.  She also reports severe fatigue. Past Medical History:  Diagnosis Date   Achalasia    Anemia    Breast cancer (HCC) 08/2006   left T1bN0Mx stage 1- s/p bilateral mastectomy TRAM flap reconstruction   Colon polyps    TUBULAR ADENOMAS (X2) AND HYPERPLASTIC POLYP(S   GERD (gastroesophageal reflux disease)    High risk medication use    Hyperlipidemia    Hypertension    no medication needed for 10-15 years   MVP (mitral valve prolapse)    Tinea pedis    Past Surgical History:  Procedure Laterality Date   APPENDECTOMY     BREAST SURGERY     COLONOSCOPY  07/08/2012   DILATATION & CURETTAGE/HYSTEROSCOPY WITH MYOSURE N/A 06/27/2016   Procedure: DILATATION & CURETTAGE/HYSTEROSCOPY WITH MYOSURE;  Surgeon: Genia Del, MD;  Location: [facility];  Service: Gynecology;  Laterality: N/A;   hellermyotomy     esophagus   MASTECTOMY  11/12/2006   bilateral, TRAM flap reconstruction   UPPER GASTROINTESTINAL ENDOSCOPY  07/08/2012   Current Outpatient Medications on File Prior to Visit  Medication Sig Dispense Refill   busPIRone (BUSPAR) 10 MG tablet TAKE 1 TABLET(10 MG) BY MOUTH TWICE DAILY 60 tablet 1   exemestane (AROMASIN) 25 MG tablet Take 25  mg by mouth daily after breakfast.     lisinopril-hydrochlorothiazide (ZESTORETIC) 20-12.5 MG tablet TAKE 1 TABLET BY MOUTH EVERY DAY 90 tablet 3   oxycodone (OXY-IR) 5 MG capsule Take 5 mg by mouth every 4 (four) hours as needed. 4 a day     pantoprazole (PROTONIX) 40 MG tablet Take 40 mg by mouth daily.     tiZANidine (ZANAFLEX) 2 MG tablet TAKE 1 TABLET(2 MG) BY MOUTH EVERY 8 HOURS AS NEEDED FOR MUSCLE SPASMS 15 tablet 0   Zoledronic Acid (ZOMETA IV) Inject into the vein.     No current facility-administered medications on file prior to visit.   Allergies  Allergen Reactions   Iohexol Hives     Code: HIVES, Desc: developed hives after injection    Neosporin [Neomycin-Bacitracin Zn-Polymyx] Rash   Tape Rash   Wound Dressing Adhesive Rash   Social History   Socioeconomic History   Marital status: Legally Separated    Spouse name: Not on file   Number of children: 2   Years of education: Not on file   Highest education level: Bachelor's degree (e.g., BA, AB, BS)  Occupational History   Occupation: Corporate investment banker in [employer]    Employer: [employer]  Tobacco Use   Smoking status: Former    Current packs/day: 1.00    Average packs/day:  1 pack/day for 35.0 years (35.0 ttl pk-yrs)    Types: Cigarettes   Smokeless tobacco: Never   Tobacco comments:    Pt is vaping  Substance and Sexual Activity   Alcohol use: Yes   Drug use: No   Sexual activity: Not on file  Other Topics Concern   Not on file  Social History Narrative   Currently separated as of January 2014   2 daughters   Sharon Seller as a Electrical engineer for [employer]   Social Determinants of Health   Financial Resource Strain: Low Risk  (02/09/2023)   Overall Financial Resource Strain (CARDIA)    Difficulty of Paying Living Expenses: Not hard at all  Food Insecurity: No Food Insecurity (02/09/2023)   Hunger Vital Sign    Worried About Running Out of Food in the Last Year: Never true    Ran  Out of Food in the Last Year: Never true  Transportation Needs: No Transportation Needs (02/09/2023)   PRAPARE - Administrator, Civil Service (Medical): No    Lack of Transportation (Non-Medical): No  Physical Activity: Unknown (02/09/2023)   Exercise Vital Sign    Days of Exercise per Week: 0 days    Minutes of Exercise per Session: Not on file  Stress: No Stress Concern Present (02/09/2023)   [employer] of Occupational Health - Occupational Stress Questionnaire    Feeling of Stress : Only a little  Social Connections: Moderately Isolated (02/09/2023)   Social Connection and Isolation Panel [NHANES]    Frequency of Communication with Friends and Family: More than three times a week    Frequency of Social Gatherings with Friends and Family: Once a week    Attends Religious Services: Never    Database administrator or Organizations: No    Attends Engineer, structural: Not on file    Marital Status: Living with partner  Intimate Partner Violence: Not on file   Family History  Problem Relation Age of Onset   Hypertension Mother    Breast cancer Mother 71       died age 4   Hypertension Paternal Grandfather    Hypertension Maternal Grandfather    Cancer Maternal Grandfather       Review of Systems  All other systems reviewed and are negative.      Objective:   Physical Exam Vitals reviewed.  Constitutional:      General: She is not in acute distress.    Appearance: She is well-developed. She is not diaphoretic.  Neck:     Thyroid: No thyromegaly.     Vascular: No JVD.     Trachea: No tracheal deviation.  Cardiovascular:     Rate and Rhythm: Normal rate and regular rhythm.     Heart sounds: Normal heart sounds. No murmur heard.    No friction rub. No gallop.  Pulmonary:     Effort: Pulmonary effort is normal. No respiratory distress.     Breath sounds: Normal breath sounds. No stridor. No wheezing or rales.  Chest:     Chest wall: No  tenderness.  Musculoskeletal:     Cervical back: Neck supple.     Lumbar back: Tenderness and bony tenderness present. Decreased range of motion.       Back:  Lymphadenopathy:     Cervical: No cervical adenopathy.  Neurological:     Mental Status: She is alert.     Motor: No abnormal muscle tone.  Deep Tendon Reflexes: Reflexes are normal and symmetric.         Assessment & Plan:   History of breast cancer  Pain from bone metastases (HCC)  Benign essential HTN - Plan: CBC with Differential/Platelet, COMPLETE METABOLIC PANEL WITH GFR, Lipid panel  Fatigue, unspecified type - Plan: Vitamin B12, Iron, TSH I would like to try the patient on fentanyl 25 mcg every 72 hours to try to better control her pain.  She can still use oxycodone for breakthrough.  We can uptitrate this as necessary to improve her quality of life.  Given her fatigue I will check a CBC, B12 level, iron level, and also a TSH.  I suspect that it is multifactorial.  Blood pressure today is well-controlled without concern she may be having hypotension at home.  I will ask her to start checking her blood pressure more at home and if low we will discontinue lisinopril/hydrochlorothiazide

## 2023-02-11 LAB — VITAMIN B12: Vitamin B-12: >2000 pg/mL — ABNORMAL HIGH (ref 200–1100)

## 2023-02-11 LAB — CBC WITH DIFFERENTIAL/PLATELET
Absolute Monocytes: 462 {cells}/uL (ref 200–950)
Basophils Absolute: 42 {cells}/uL (ref 0–200)
Basophils Relative: 0.6 %
Eosinophils Absolute: 329 {cells}/uL (ref 15–500)
Eosinophils Relative: 4.7 %
HCT: 35.4 % (ref 35.0–45.0)
Hemoglobin: 11.3 g/dL — ABNORMAL LOW (ref 11.7–15.5)
Lymphs Abs: 1267 {cells}/uL (ref 850–3900)
MCH: 28.1 pg (ref 27.0–33.0)
MCHC: 31.9 g/dL — ABNORMAL LOW (ref 32.0–36.0)
MCV: 88.1 fL (ref 80.0–100.0)
MPV: 9.8 fL (ref 7.5–12.5)
Monocytes Relative: 6.6 %
Neutro Abs: 4900 {cells}/uL (ref 1500–7800)
Neutrophils Relative %: 70 %
Platelets: 365 10*3/uL (ref 140–400)
RBC: 4.02 10*6/uL (ref 3.80–5.10)
RDW: 16.5 % — ABNORMAL HIGH (ref 11.0–15.0)
Total Lymphocyte: 18.1 %
WBC: 7 10*3/uL (ref 3.8–10.8)

## 2023-02-11 LAB — COMPLETE METABOLIC PANEL WITH GFR
AG Ratio: 1.4 (calc) (ref 1.0–2.5)
ALT: 16 U/L (ref 6–29)
AST: 19 U/L (ref 10–35)
Albumin: 3.7 g/dL (ref 3.6–5.1)
Alkaline phosphatase (APISO): 49 U/L (ref 37–153)
BUN: 17 mg/dL (ref 7–25)
CO2: 28 mmol/L (ref 20–32)
Calcium: 9.3 mg/dL (ref 8.6–10.4)
Chloride: 99 mmol/L (ref 98–110)
Creat: 0.67 mg/dL (ref 0.50–1.05)
Globulin: 2.7 g/dL (ref 1.9–3.7)
Glucose, Bld: 102 mg/dL — ABNORMAL HIGH (ref 65–99)
Potassium: 3.9 mmol/L (ref 3.5–5.3)
Sodium: 136 mmol/L (ref 135–146)
Total Bilirubin: 0.5 mg/dL (ref 0.2–1.2)
Total Protein: 6.4 g/dL (ref 6.1–8.1)
eGFR: 100 mL/min/{1.73_m2} (ref >=60)

## 2023-02-11 LAB — LIPID PANEL
Cholesterol: 229 mg/dL — ABNORMAL HIGH (ref <200)
HDL: 69 mg/dL (ref >=50)
LDL Cholesterol (Calc): 132 mg/dL — ABNORMAL HIGH
Non-HDL Cholesterol (Calc): 160 mg/dL — ABNORMAL HIGH (ref <130)
Total CHOL/HDL Ratio: 3.3 (calc) (ref <5.0)
Triglycerides: 150 mg/dL — ABNORMAL HIGH (ref <150)

## 2023-02-11 LAB — TSH: TSH: 1.64 m[IU]/L (ref 0.40–4.50)

## 2023-02-11 LAB — IRON: Iron: 73 ug/dL (ref 45–160)

## 2023-04-03 ENCOUNTER — Other Ambulatory Visit: Payer: Self-pay | Admitting: Family Medicine

## 2023-04-04 ENCOUNTER — Encounter: Payer: Self-pay | Admitting: Family Medicine

## 2023-04-04 NOTE — Telephone Encounter (Signed)
Last OV 02/10/23 within protocol.  Requested Prescriptions  Pending Prescriptions Disp Refills   busPIRone (BUSPAR) 10 MG tablet [Pharmacy Med Name: BUSPIRONE 10MG  TABLETS] 180 tablet 0    Sig: TAKE 1 TABLET(10 MG) BY MOUTH TWICE DAILY     Psychiatry: Anxiolytics/Hypnotics - Non-controlled Failed - 04/03/2023 10:13 PM      Failed - Valid encounter within last 12 months    Recent Outpatient Visits           1 year ago Benign paroxysmal positional vertigo of left ear   [facility] Donita Brooks, MD   1 year ago Acute bacterial sinusitis   [facility] Valentino Nose, NP   2 years ago Herpes zoster without complication   [facility] Valentino Nose, NP   2 years ago Cervical radiculopathy   [facility] Tanya Nones Priscille Heidelberg, MD   4 years ago Left sided sciatica   [facility] Pickard, Priscille Heidelberg, MD

## 2023-04-07 ENCOUNTER — Telehealth: Payer: BC Managed Care – PPO | Admitting: Family Medicine

## 2023-04-07 DIAGNOSIS — C50919 Malignant neoplasm of unspecified site of unspecified female breast: Secondary | ICD-10-CM | POA: Diagnosis not present

## 2023-04-07 DIAGNOSIS — F17200 Nicotine dependence, unspecified, uncomplicated: Secondary | ICD-10-CM

## 2023-04-07 DIAGNOSIS — Z716 Tobacco abuse counseling: Secondary | ICD-10-CM

## 2023-04-07 DIAGNOSIS — C7951 Secondary malignant neoplasm of bone: Secondary | ICD-10-CM

## 2023-04-07 MED ORDER — PANTOPRAZOLE SODIUM 40 MG PO TBEC: 40.0000 mg | DELAYED_RELEASE_TABLET | Freq: Every day | ORAL | 3 refills | Status: AC

## 2023-04-07 NOTE — Progress Notes (Signed)
Subjective:    Patient ID: Jessica Dean, female    DOB: 1961-04-07, 62 y.o.   MRN: [id number]  Nicotine Dependence Her urge triggers include company of smokers.   Patient is being seen today as a telehealth/video visit.  Patient is currently at home.  I am currently in my office.  Video visit began at 3 38.  Visit concluded at 348.  Patient consents to be seen via telephone/video conference.  Patient request help with tobacco cessation.  She is not smoking.  She quit smoking several years ago.  However she does continue to vape.  She uses nicotine containing vape products.  We discussed the long-term risk of heart disease with nicotine containing products.  We also discussed the unknown risk of prolonged vaping.  Patient has been educated regarding nicotine replacement in the past and has patches at home.  However she is in the precontemplative phase of cessation.  She is currently dealing with metastatic breast cancer.  She is currently on chemotherapy.  Thankfully she is not having to use the oxycodone as often as she was.  She was taking 2 tablets every 4-6 hours.  She has been able to stop that recently which may be a sign of the radiation treatments to the bone metastasis are helping. Past Medical History:  Diagnosis Date   Achalasia    Anemia    Breast cancer (HCC) 08/2006   left T1bN0Mx stage 1- s/p bilateral mastectomy TRAM flap reconstruction   Colon polyps    TUBULAR ADENOMAS (X2) AND HYPERPLASTIC POLYP(S   GERD (gastroesophageal reflux disease)    High risk medication use    Hyperlipidemia    Hypertension    no medication needed for 10-15 years   MVP (mitral valve prolapse)    Tinea pedis    Past Surgical History:  Procedure Laterality Date   APPENDECTOMY     BREAST SURGERY     COLONOSCOPY  07/08/2012   DILATATION & CURETTAGE/HYSTEROSCOPY WITH MYOSURE N/A 06/27/2016   Procedure: DILATATION & CURETTAGE/HYSTEROSCOPY WITH MYOSURE;  Surgeon: Genia Del, MD;  Location: [facility];  Service: Gynecology;  Laterality: N/A;   hellermyotomy     esophagus   MASTECTOMY  11/12/2006   bilateral, TRAM flap reconstruction   UPPER GASTROINTESTINAL ENDOSCOPY  07/08/2012   Current Outpatient Medications on File Prior to Visit  Medication Sig Dispense Refill   busPIRone (BUSPAR) 10 MG tablet TAKE 1 TABLET(10 MG) BY MOUTH TWICE DAILY 180 tablet 0   exemestane (AROMASIN) 25 MG tablet Take 25 mg by mouth daily after breakfast.     fentaNYL (DURAGESIC) 25 MCG/HR Place 1 patch onto the skin every 3 (three) days. 10 patch 0   lisinopril-hydrochlorothiazide (ZESTORETIC) 20-12.5 MG tablet TAKE 1 TABLET BY MOUTH EVERY DAY 90 tablet 3   oxycodone (OXY-IR) 5 MG capsule Take 5 mg by mouth every 4 (four) hours as needed. 4 a day     tiZANidine (ZANAFLEX) 2 MG tablet TAKE 1 TABLET(2 MG) BY MOUTH EVERY 8 HOURS AS NEEDED FOR MUSCLE SPASMS 15 tablet 0   Zoledronic Acid (ZOMETA IV) Inject into the vein.     No current facility-administered medications on file prior to visit.   Allergies  Allergen Reactions   Iohexol Hives     Code: HIVES, Desc: developed hives after injection    Neosporin [Neomycin-Bacitracin Zn-Polymyx] Rash   Tape Rash   Wound Dressing Adhesive Rash   Social History   Socioeconomic History   Marital status: Legally  Separated    Spouse name: Not on file   Number of children: 2   Years of education: Not on file   Highest education level: Bachelor's degree (e.g., BA, AB, BS)  Occupational History   Occupation: Corporate investment banker in HR    Employer: [employer]  Tobacco Use   Smoking status: Former    Current packs/day: 1.00    Average packs/day: 1 pack/day for 35.0 years (35.0 ttl pk-yrs)    Types: Cigarettes   Smokeless tobacco: Never   Tobacco comments:    Pt is vaping  Substance and Sexual Activity   Alcohol use: Yes   Drug use: No   Sexual activity: Not on file  Other Topics Concern   Not on file  Social History Narrative   Currently  separated as of January 2014   2 daughters   Sharon Seller as a Electrical engineer for [employer]   Social Determinants of Health   Financial Resource Strain: Low Risk  (02/09/2023)   Overall Financial Resource Strain (CARDIA)    Difficulty of Paying Living Expenses: Not hard at all  Food Insecurity: No Food Insecurity (02/09/2023)   Hunger Vital Sign    Worried About Running Out of Food in the Last Year: Never true    Ran Out of Food in the Last Year: Never true  Transportation Needs: No Transportation Needs (02/09/2023)   PRAPARE - Administrator, Civil Service (Medical): No    Lack of Transportation (Non-Medical): No  Physical Activity: Unknown (02/09/2023)   Exercise Vital Sign    Days of Exercise per Week: 0 days    Minutes of Exercise per Session: Not on file  Stress: No Stress Concern Present (02/09/2023)   [employer] of Occupational Health - Occupational Stress Questionnaire    Feeling of Stress : Only a little  Social Connections: Moderately Isolated (02/09/2023)   Social Connection and Isolation Panel [NHANES]    Frequency of Communication with Friends and Family: More than three times a week    Frequency of Social Gatherings with Friends and Family: Once a week    Attends Religious Services: Never    Database administrator or Organizations: No    Attends Engineer, structural: Not on file    Marital Status: Living with partner  Intimate Partner Violence: Not on file     Review of Systems  All other systems reviewed and are negative.      Objective:   Physical Exam   Video visit     Assessment & Plan:  Breast cancer metastasized to bone, unspecified laterality (HCC)  Nicotine dependence due to vaping non-tobacco product  Encounter for tobacco use cessation counseling Spent most of the visit discussing treatment for pain related to the bone metastasis.  Patient is healthy well.  She is meeting with her palliative care  specialist at [facility] and will keep me informed if they need to make a change in her pain medication.  I recommended nicotine replacement such as patches or gum rather than using vaping products.  Patient is in the precontemplative phase.

## 2023-07-09 ENCOUNTER — Other Ambulatory Visit: Payer: Self-pay | Admitting: Family Medicine

## 2023-09-10 ENCOUNTER — Other Ambulatory Visit: Payer: Self-pay | Admitting: Family Medicine

## 2023-09-11 NOTE — Telephone Encounter (Signed)
 OV 04/07/23- needs appointment next month Requested Prescriptions  Pending Prescriptions Disp Refills   lisinopril-hydrochlorothiazide (ZESTORETIC) 20-12.5 MG tablet [Pharmacy Med Name: LISINOPRIL-HCTZ 20/12.5MG  TABLETS] 90 tablet 3    Sig: TAKE 1 TABLET BY MOUTH EVERY DAY     Cardiovascular:  ACEI + Diuretic Combos Failed - 09/11/2023 12:25 PM      Failed - Na in normal range and within 180 days    Sodium  Date Value Ref Range Status  02/10/2023 136 135 - 146 mmol/L Final         Failed - K in normal range and within 180 days    Potassium  Date Value Ref Range Status  02/10/2023 3.9 3.5 - 5.3 mmol/L Final         Failed - Cr in normal range and within 180 days    Creat  Date Value Ref Range Status  02/10/2023 0.67 0.50 - 1.05 mg/dL Final         Failed - eGFR is 30 or above and within 180 days    GFR, Est African American  Date Value Ref Range Status  09/04/2018 103 > OR = 60 mL/min/1.30m2 Final   GFR, Est Non African American  Date Value Ref Range Status  09/04/2018 89 > OR = 60 mL/min/1.50m2 Final   eGFR  Date Value Ref Range Status  02/10/2023 100 > OR = 60 mL/min/1.3m2 Final         Failed - Valid encounter within last 6 months    Recent Outpatient Visits           2 years ago Benign paroxysmal positional vertigo of left ear   [facility] Donita Brooks, MD   2 years ago Acute bacterial sinusitis   [facility] Valentino Nose, NP   2 years ago Herpes zoster without complication   [facility] Valentino Nose, NP   3 years ago Cervical radiculopathy   [facility] Donita Brooks, MD   4 years ago Left sided sciatica   [facility] Pickard, Priscille Heidelberg, MD              Passed - Patient is not pregnant      Passed - Last BP in normal range    BP Readings from Last 1 Encounters:  02/10/23 124/68

## 2023-10-03 ENCOUNTER — Other Ambulatory Visit: Payer: Self-pay | Admitting: Family Medicine

## 2023-10-03 NOTE — Telephone Encounter (Signed)
 Requested Prescriptions  Pending Prescriptions Disp Refills   busPIRone (BUSPAR) 10 MG tablet [Pharmacy Med Name: BUSPIRONE 10MG  TABLETS] 180 tablet 0    Sig: TAKE 1 TABLET(10 MG) BY MOUTH TWICE DAILY     Psychiatry: Anxiolytics/Hypnotics - Non-controlled Passed - 10/03/2023  4:27 PM      Passed - Valid encounter within last 12 months    Recent Outpatient Visits           5 months ago Breast cancer metastasized to bone, unspecified laterality [facility])   [place] [facility] Pickard, Priscille Heidelberg, MD   7 months ago History of breast cancer   [facility] Donita Brooks, MD   1 year ago DDD (degenerative disc disease), lumbar   [facility] Donita Brooks, MD   1 year ago Lymphadenopathy, axillary   [facility] Donita Brooks, MD   1 year ago Left arm swelling   [facility] Pickard, Priscille Heidelberg, MD

## 2023-10-13 DIAGNOSIS — M5090 Cervical disc disorder, unspecified, unspecified cervical region: Secondary | ICD-10-CM | POA: Insufficient documentation

## 2023-12-19 ENCOUNTER — Other Ambulatory Visit: Payer: Self-pay | Admitting: Family Medicine

## 2024-01-21 ENCOUNTER — Other Ambulatory Visit: Payer: Self-pay | Admitting: Family Medicine

## 2024-02-09 ENCOUNTER — Other Ambulatory Visit: Payer: Self-pay | Admitting: Family Medicine

## 2024-02-16 DIAGNOSIS — M6281 Muscle weakness (generalized): Secondary | ICD-10-CM | POA: Insufficient documentation

## 2024-02-16 DIAGNOSIS — G8929 Other chronic pain: Secondary | ICD-10-CM | POA: Insufficient documentation

## 2024-02-16 DIAGNOSIS — M542 Cervicalgia: Secondary | ICD-10-CM | POA: Insufficient documentation

## 2024-03-03 ENCOUNTER — Telehealth: Payer: Self-pay

## 2024-03-03 DIAGNOSIS — E66811 Obesity, class 1: Secondary | ICD-10-CM | POA: Insufficient documentation

## 2024-03-03 NOTE — Telephone Encounter (Signed)
 Copied from CRM #[id number]. Topic: General - Other >> Mar 02, 2024  5:02 PM Leah W wrote: Reason for CRM: Bari, nurse case manager with [facility], called to alert PCP that she is following pt for care. If PCP or clinic want to reach her, please call [phone]    Requested notes faxed to [employer] at [phone]. Mjp,lpn

## 2024-03-03 NOTE — Telephone Encounter (Signed)
 Copied from CRM #[id number]. Topic: General - Other >> Mar 02, 2024  5:02 PM Leah W wrote: Reason for CRM: Bari, nurse case manager with [employer], called to alert PCP that she is following pt for care. If PCP or clinic want to reach her, please call [phone]

## 2024-03-19 ENCOUNTER — Other Ambulatory Visit: Payer: Self-pay | Admitting: Family Medicine

## 2024-03-22 NOTE — Telephone Encounter (Signed)
 Requested medications are due for refill today.  yes  Requested medications are on the active medications list.  yes  Last refill. 12/22/2023 #90 0 rf  Future visit scheduled.   no  Notes to clinic.  Labs are expired.    Requested Prescriptions  Pending Prescriptions Disp Refills   lisinopril -hydrochlorothiazide  (ZESTORETIC ) 20-12.5 MG tablet [Pharmacy Med Name: LISINOPRIL -HCTZ 20/12.5MG  TABLETS] 90 tablet 0    Sig: TAKE 1 TABLET BY MOUTH EVERY DAY     Cardiovascular:  ACEI + Diuretic Combos Failed - 03/22/2024  3:08 PM      Failed - Na in normal range and within 180 days    Sodium  Date Value Ref Range Status  02/10/2023 136 135 - 146 mmol/L Final         Failed - K in normal range and within 180 days    Potassium  Date Value Ref Range Status  02/10/2023 3.9 3.5 - 5.3 mmol/L Final         Failed - Cr in normal range and within 180 days    Creat  Date Value Ref Range Status  02/10/2023 0.67 0.50 - 1.05 mg/dL Final         Failed - eGFR is 30 or above and within 180 days    GFR, Est African American  Date Value Ref Range Status  09/04/2018 103 > OR = 60 mL/min/1.72m2 Final   GFR, Est Non African American  Date Value Ref Range Status  09/04/2018 89 > OR = 60 mL/min/1.40m2 Final   eGFR  Date Value Ref Range Status  02/10/2023 100 > OR = 60 mL/min/1.70m2 Final         Failed - Valid encounter within last 6 months    Recent Outpatient Visits           11 months ago Breast cancer metastasized to bone, unspecified laterality [facility])   [place] [facility] Duanne Butler DASEN, MD   1 year ago History of breast cancer   [place] [facility] Duanne Butler DASEN, MD   1 year ago DDD (degenerative disc disease), lumbar   [place] [facility] Duanne Butler DASEN, MD   2 years ago Lymphadenopathy, axillary   [place] [facility] Duanne Butler DASEN, MD   2 years ago Left arm swelling   [place]  [facility] Pickard, Butler DASEN, MD              Passed - Patient is not pregnant      Passed - Last BP in normal range    BP Readings from Last 1 Encounters:  02/10/23 124/68

## 2024-03-22 NOTE — Telephone Encounter (Signed)
 Requested medications are due for refill today.  yes  Requested medications are on the active medications list.  yes  Last refill. 12/22/2023 #90 0 rf  Future visit scheduled.   no  Notes to clinic.  Labs are expired.    Requested Prescriptions  Pending Prescriptions Disp Refills   lisinopril -hydrochlorothiazide  (ZESTORETIC ) 20-12.5 MG tablet [Pharmacy Med Name: LISINOPRIL -HCTZ 20/12.5MG  TABLETS] 90 tablet 0    Sig: TAKE 1 TABLET BY MOUTH EVERY DAY     Cardiovascular:  ACEI + Diuretic Combos Failed - 03/22/2024  2:05 PM      Failed - Na in normal range and within 180 days    Sodium  Date Value Ref Range Status  02/10/2023 136 135 - 146 mmol/L Final         Failed - K in normal range and within 180 days    Potassium  Date Value Ref Range Status  02/10/2023 3.9 3.5 - 5.3 mmol/L Final         Failed - Cr in normal range and within 180 days    Creat  Date Value Ref Range Status  02/10/2023 0.67 0.50 - 1.05 mg/dL Final         Failed - eGFR is 30 or above and within 180 days    GFR, Est African American  Date Value Ref Range Status  09/04/2018 103 > OR = 60 mL/min/1.90m2 Final   GFR, Est Non African American  Date Value Ref Range Status  09/04/2018 89 > OR = 60 mL/min/1.74m2 Final   eGFR  Date Value Ref Range Status  02/10/2023 100 > OR = 60 mL/min/1.21m2 Final         Failed - Valid encounter within last 6 months    Recent Outpatient Visits           11 months ago Breast cancer metastasized to bone, unspecified laterality [facility])   [place] [facility] Duanne Butler DASEN, MD   1 year ago History of breast cancer   [place] [facility] Duanne Butler DASEN, MD   1 year ago DDD (degenerative disc disease), lumbar   [place] [facility] Duanne Butler DASEN, MD   2 years ago Lymphadenopathy, axillary   [place] [facility] Duanne Butler DASEN, MD   2 years ago Left arm swelling   [place]  [facility] Pickard, Butler DASEN, MD              Passed - Patient is not pregnant      Passed - Last BP in normal range    BP Readings from Last 1 Encounters:  02/10/23 124/68

## 2024-03-22 NOTE — Telephone Encounter (Signed)
 Prescription Request  03/22/2024  LOV: 02/10/2023  What is the name of the medication or equipment?   lisinopril -hydrochlorothiazide  (ZESTORETIC ) 20-12.5 MG tablet   Have you contacted your pharmacy to request a refill? Yes   Which pharmacy would you like this sent to?  [id number] - [address], [place] - [address] [facility] [address] [address] Phone: [phone] Fax: [phone]    Patient notified that their request is being sent to the clinical staff for review and that they should receive a response within 2 business days.   Please advise pharmacist.

## 2024-03-24 ENCOUNTER — Other Ambulatory Visit: Payer: Self-pay | Admitting: Family Medicine
